# Patient Record
Sex: Female | Born: 1935 | Race: White | Hispanic: No | Smoking: Former smoker
Health system: Southern US, Community
[De-identification: ages and names within clinical notes are randomized; demographics above are authoritative.]

## PROBLEM LIST (undated history)

## (undated) DIAGNOSIS — I10 Essential (primary) hypertension: Secondary | ICD-10-CM

## (undated) DIAGNOSIS — G629 Polyneuropathy, unspecified: Secondary | ICD-10-CM

## (undated) DIAGNOSIS — F419 Anxiety disorder, unspecified: Secondary | ICD-10-CM

## (undated) DIAGNOSIS — N289 Disorder of kidney and ureter, unspecified: Secondary | ICD-10-CM

## (undated) DIAGNOSIS — L409 Psoriasis, unspecified: Secondary | ICD-10-CM

## (undated) DIAGNOSIS — E781 Pure hyperglyceridemia: Secondary | ICD-10-CM

## (undated) HISTORY — PX: JOINT REPLACEMENT: SHX530

## (undated) HISTORY — PX: OTHER SURGICAL HISTORY: SHX169

---

## 2012-08-09 ENCOUNTER — Ambulatory Visit: Payer: Self-pay | Admitting: Nurse Practitioner

## 2013-08-06 ENCOUNTER — Ambulatory Visit: Payer: Self-pay | Admitting: Ophthalmology

## 2015-02-19 ENCOUNTER — Encounter: Payer: Self-pay | Admitting: Emergency Medicine

## 2015-02-19 ENCOUNTER — Emergency Department: Payer: Medicare Other

## 2015-02-19 ENCOUNTER — Inpatient Hospital Stay
Admission: EM | Admit: 2015-02-19 | Discharge: 2015-02-24 | DRG: 563 | Disposition: A | Discharge status: Skilled Nursing Facility | Payer: Medicare Other | Attending: Internal Medicine | Admitting: Internal Medicine

## 2015-02-19 DIAGNOSIS — Z66 Do not resuscitate: Secondary | ICD-10-CM | POA: Diagnosis present

## 2015-02-19 DIAGNOSIS — D72829 Elevated white blood cell count, unspecified: Secondary | ICD-10-CM | POA: Diagnosis present

## 2015-02-19 DIAGNOSIS — S92351A Displaced fracture of fifth metatarsal bone, right foot, initial encounter for closed fracture: Secondary | ICD-10-CM | POA: Diagnosis present

## 2015-02-19 DIAGNOSIS — N184 Chronic kidney disease, stage 4 (severe): Secondary | ICD-10-CM | POA: Diagnosis present

## 2015-02-19 DIAGNOSIS — Z87891 Personal history of nicotine dependence: Secondary | ICD-10-CM

## 2015-02-19 DIAGNOSIS — I129 Hypertensive chronic kidney disease with stage 1 through stage 4 chronic kidney disease, or unspecified chronic kidney disease: Secondary | ICD-10-CM | POA: Diagnosis present

## 2015-02-19 DIAGNOSIS — D649 Anemia, unspecified: Secondary | ICD-10-CM | POA: Diagnosis present

## 2015-02-19 DIAGNOSIS — S82101A Unspecified fracture of upper end of right tibia, initial encounter for closed fracture: Principal | ICD-10-CM | POA: Diagnosis present

## 2015-02-19 DIAGNOSIS — I2510 Atherosclerotic heart disease of native coronary artery without angina pectoris: Secondary | ICD-10-CM | POA: Diagnosis present

## 2015-02-19 DIAGNOSIS — F419 Anxiety disorder, unspecified: Secondary | ICD-10-CM | POA: Diagnosis present

## 2015-02-19 DIAGNOSIS — Z882 Allergy status to sulfonamides status: Secondary | ICD-10-CM

## 2015-02-19 DIAGNOSIS — E781 Pure hyperglyceridemia: Secondary | ICD-10-CM | POA: Diagnosis present

## 2015-02-19 DIAGNOSIS — Z9660 Presence of unspecified orthopedic joint implant: Secondary | ICD-10-CM | POA: Diagnosis present

## 2015-02-19 DIAGNOSIS — S92301A Fracture of unspecified metatarsal bone(s), right foot, initial encounter for closed fracture: Secondary | ICD-10-CM | POA: Diagnosis not present

## 2015-02-19 DIAGNOSIS — W109XXA Fall (on) (from) unspecified stairs and steps, initial encounter: Secondary | ICD-10-CM | POA: Diagnosis present

## 2015-02-19 DIAGNOSIS — S82209A Unspecified fracture of shaft of unspecified tibia, initial encounter for closed fracture: Secondary | ICD-10-CM | POA: Diagnosis present

## 2015-02-19 DIAGNOSIS — Z8249 Family history of ischemic heart disease and other diseases of the circulatory system: Secondary | ICD-10-CM

## 2015-02-19 HISTORY — DX: Anxiety disorder, unspecified: F41.9

## 2015-02-19 HISTORY — DX: Disorder of kidney and ureter, unspecified: N28.9

## 2015-02-19 HISTORY — DX: Polyneuropathy, unspecified: G62.9

## 2015-02-19 HISTORY — DX: Pure hyperglyceridemia: E78.1

## 2015-02-19 HISTORY — DX: Essential (primary) hypertension: I10

## 2015-02-19 HISTORY — DX: Psoriasis, unspecified: L40.9

## 2015-02-19 LAB — CBC WITH DIFFERENTIAL/PLATELET
BASOS ABS: 0.1 10*3/uL (ref 0–0.1)
EOS ABS: 0.2 10*3/uL (ref 0–0.7)
HCT: 30.2 % — ABNORMAL LOW (ref 35.0–47.0)
Hemoglobin: 10 g/dL — ABNORMAL LOW (ref 12.0–16.0)
Lymphocytes Relative: 13 %
Lymphs Abs: 1.5 10*3/uL (ref 1.0–3.6)
MCH: 30.3 pg (ref 26.0–34.0)
MCHC: 33.1 g/dL (ref 32.0–36.0)
MCV: 91.6 fL (ref 80.0–100.0)
MONO ABS: 0.9 10*3/uL (ref 0.2–0.9)
Monocytes Relative: 8 %
Neutro Abs: 8.8 10*3/uL — ABNORMAL HIGH (ref 1.4–6.5)
Neutrophils Relative %: 77 %
PLATELETS: 311 10*3/uL (ref 150–440)
RBC: 3.3 MIL/uL — ABNORMAL LOW (ref 3.80–5.20)
RDW: 15.7 % — AB (ref 11.5–14.5)
WBC: 11.5 10*3/uL — ABNORMAL HIGH (ref 3.6–11.0)

## 2015-02-19 LAB — BASIC METABOLIC PANEL
ANION GAP: 10 (ref 5–15)
BUN: 47 mg/dL — ABNORMAL HIGH (ref 6–20)
CALCIUM: 9.8 mg/dL (ref 8.9–10.3)
CO2: 23 mmol/L (ref 22–32)
Chloride: 107 mmol/L (ref 101–111)
Creatinine, Ser: 2.38 mg/dL — ABNORMAL HIGH (ref 0.44–1.00)
GFR, EST AFRICAN AMERICAN: 21 mL/min — AB (ref >60)
GFR, EST NON AFRICAN AMERICAN: 18 mL/min — AB (ref >60)
GLUCOSE: 111 mg/dL — AB (ref 65–99)
Potassium: 5 mmol/L (ref 3.5–5.1)
SODIUM: 140 mmol/L (ref 135–145)

## 2015-02-19 MED ORDER — FENOFIBRATE 160 MG PO TABS
160.0000 mg | ORAL_TABLET | Freq: Every day | ORAL | Status: DC
  Administered 2015-02-20 – 2015-02-24 (×5): 160 mg via ORAL
  Filled 2015-02-19 (×5): qty 1

## 2015-02-19 MED ORDER — OXYCODONE HCL 5 MG PO TABS
5.0000 mg | ORAL_TABLET | ORAL | Status: DC | PRN
  Administered 2015-02-19 – 2015-02-20 (×2): 5 mg via ORAL
  Filled 2015-02-19 (×2): qty 1

## 2015-02-19 MED ORDER — ALPRAZOLAM 0.25 MG PO TABS
0.2500 mg | ORAL_TABLET | Freq: Three times a day (TID) | ORAL | Status: DC | PRN
  Administered 2015-02-22: 0.25 mg via ORAL
  Filled 2015-02-19: qty 1

## 2015-02-19 MED ORDER — ACETAMINOPHEN 650 MG RE SUPP: 650.0000 mg | Freq: Four times a day (QID) | RECTAL | Status: DC | PRN

## 2015-02-19 MED ORDER — ENOXAPARIN SODIUM 30 MG/0.3ML ~~LOC~~ SOLN: 30.0000 mg | SUBCUTANEOUS | Status: DC

## 2015-02-19 MED ORDER — METOPROLOL SUCCINATE ER 100 MG PO TB24
100.0000 mg | ORAL_TABLET | Freq: Every day | ORAL | Status: DC
  Administered 2015-02-20 – 2015-02-24 (×5): 100 mg via ORAL
  Filled 2015-02-19 (×5): qty 1

## 2015-02-19 MED ORDER — AMLODIPINE BESYLATE 5 MG PO TABS
5.0000 mg | ORAL_TABLET | Freq: Every day | ORAL | Status: DC
  Administered 2015-02-20 – 2015-02-23 (×4): 5 mg via ORAL
  Filled 2015-02-19 (×4): qty 1

## 2015-02-19 MED ORDER — OXYCODONE-ACETAMINOPHEN 5-325 MG PO TABS
1.0000 | ORAL_TABLET | Freq: Once | ORAL | Status: AC
  Administered 2015-02-19: 1 via ORAL
  Filled 2015-02-19: qty 1

## 2015-02-19 MED ORDER — LORATADINE 10 MG PO TABS
10.0000 mg | ORAL_TABLET | Freq: Every day | ORAL | Status: DC
  Administered 2015-02-19 – 2015-02-24 (×5): 10 mg via ORAL
  Filled 2015-02-19 (×5): qty 1

## 2015-02-19 MED ORDER — LISINOPRIL 20 MG PO TABS
40.0000 mg | ORAL_TABLET | Freq: Every day | ORAL | Status: DC
  Administered 2015-02-20 – 2015-02-22 (×3): 40 mg via ORAL
  Administered 2015-02-23 (×2): 20 mg via ORAL
  Administered 2015-02-24: 40 mg via ORAL
  Filled 2015-02-19: qty 2
  Filled 2015-02-19: qty 1
  Filled 2015-02-19 (×4): qty 2

## 2015-02-19 MED ORDER — ASPIRIN EC 81 MG PO TBEC
81.0000 mg | DELAYED_RELEASE_TABLET | Freq: Every day | ORAL | Status: DC
  Administered 2015-02-20 – 2015-02-24 (×5): 81 mg via ORAL
  Filled 2015-02-19 (×5): qty 1

## 2015-02-19 MED ORDER — ACETAMINOPHEN 325 MG PO TABS: 650.0000 mg | ORAL_TABLET | Freq: Four times a day (QID) | ORAL | Status: DC | PRN

## 2015-02-19 NOTE — H&P (Signed)
[facility] [place] [facility]   PATIENT NAME: Lacey Smith    MR#:  [id number]  DATE OF BIRTH:  1928-12-10  DATE OF ADMISSION:  02/19/2015  PRIMARY CARE PHYSICIAN: Imelda Pillow, NP   REQUESTING/REFERRING PHYSICIAN: Dr. Derrill Kay  CHIEF COMPLAINT:   Chief Complaint  Patient presents with  . Fall    HISTORY OF PRESENT ILLNESS:  Cara Thaxton  is a 81 y.o. female with a known history of hypertension, stage III chronic kidney disease, hypertriglyceridemia. Patient states that she had a fall out stairs on the stone steps down 3 steps. She states the steps were wet. She had her cell phone on her and called family. She was on the ground in the range of 30 minutes. She does have some pain in the right lower extremity and foot. In the ER she was found to have a nondisplaced oblique fracture of the proximal tibia and possibly fibula and also fracture of the fifth metatarsal. Orthopedics was called from the ER and advised leg immobilizer and follow-up in the office within a week. The patient was unable to put any weight down on the foot and leg and hospitalist services were contacted for further evaluation.  PAST MEDICAL HISTORY:   Past Medical History  Diagnosis Date  . Renal disorder   . Hypertension   . Hypertriglyceridemia   . Anxiety   . Neuropathy   . Psoriasis     PAST SURGICAL HISTORY:   Past Surgical History  Procedure Laterality Date  . Joint replacement    . Cataracts      SOCIAL HISTORY:   Social History  Substance Use Topics  . Smoking status: Former Smoker -- 55 years    Quit date: 02/18/2005  . Smokeless tobacco: Not on file  . Alcohol Use: No    FAMILY HISTORY:   Family History  Problem Relation Age of Onset  . Heart failure Mother   . Heart attack Father   . Rheumatic fever Father     DRUG ALLERGIES:   Allergies  Allergen Reactions  . Sulfa Antibiotics     REVIEW OF SYSTEMS:  CONSTITUTIONAL: No fever,  cold feeling after getting in from the rain. Shaking episode earlier. Some weight loss. EYES: No blurred or double vision. Wears glasses EARS, NOSE, AND THROAT: No tinnitus or ear pain. No sore throat. Some decreased hearing. Positive for runny nose RESPIRATORY: No cough, occasional shortness of breath, no wheezing or hemoptysis.  CARDIOVASCULAR: No chest pain, orthopnea, edema.  GASTROINTESTINAL: No nausea, vomiting, diarrhea or abdominal pain. No blood in bowel movements GENITOURINARY: No dysuria, hematuria.  ENDOCRINE: No polyuria, nocturia,  HEMATOLOGY: No anemia, easy bruising or bleeding SKIN: History of psoriasis MUSCULOSKELETAL: Positive for arthritis pain NEUROLOGIC: No tingling, numbness, weakness.  PSYCHIATRY: Occasional anxiety  MEDICATIONS AT HOME:   Prior to Admission medications   Not on File    Patient takes Norvasc 5 mg by mouth daily, metoprolol 100 mg by mouth daily, lisinopril 40 mg by mouth daily, fenofibrate 145 mg daily, Xanax 0.25 mg when necessary, Zyrtec 10 mg daily, Centrum Silver 1 tablet daily, aspirin 81 mg daily, calcium and vitamin D and Fish oil.  VITAL SIGNS:  Blood pressure 170/76, pulse 72, temperature 98.1 F (36.7 C), temperature source Oral, resp. rate 16, height  (1.651 m), weight 65.318 kg (144 lb), SpO2 100 %.  PHYSICAL EXAMINATION:  GENERAL:  81 y.o.-year-old patient lying in the bed with no acute distress.  EYES: Pupils  equal, round, reactive to light and accommodation. No scleral icterus. Extraocular muscles intact.  HEENT: Head atraumatic, normocephalic. Oropharynx and nasopharynx clear.  NECK:  Supple, no jugular venous distention. No thyroid enlargement, no tenderness.  LUNGS: Normal breath sounds bilaterally, no wheezing, rales,rhonchi or crepitation. No use of accessory muscles of respiration.  CARDIOVASCULAR: S1, S2 normal. No murmurs, rubs, or gallops.  ABDOMEN: Soft, nontender, nondistended. Bowel sounds present. No  organomegaly or mass.  EXTREMITIES: No pedal edema, cyanosis, or clubbing.  NEUROLOGIC: Cranial nerves II through XII are intact. Muscle strength 5/5 in upper extremities. Sensation intact to light touch bilateral feet. Patient able to push down with her toes but very painful with the right foot PSYCHIATRIC: The patient is alert and oriented x 3.  SKIN: Bruising right forehead. Bruising right lateral foot with some scrapes. Also scraped left shin. Right leg in an immobilizer  LABORATORY PANEL:  Sent  RADIOLOGY:  Dg Tibia/fibula Right  02/19/2015   CLINICAL DATA:  Acute right lower leg pain following fall today. Initial encounter.  EXAM: RIGHT TIBIA AND FIBULA - 2 VIEW  COMPARISON:  None.  FINDINGS: A nondisplaced oblique fracture of the proximal tibia is seen only on the lateral view is noted.  A possible nondisplaced fracture of the fibular head is present.  There is no evidence of subluxation or dislocation.  IMPRESSION: Nondisplaced oblique fracture of the proximal tibia and possible nondisplaced fracture of the fibular head.   Electronically Signed   By: Harmon Pier M.D.   On: 02/19/2015 16:01   Ct Head Wo Contrast  02/19/2015   CLINICAL DATA:  The patient was walking down stone steps and tripped and fell around 0820 this AM. Pt has swelling and bruising to right knee and foot. Pt has abrasion noted to left knee and shin. Pt also has hematoma noted to right side of forehead.  EXAM: CT HEAD WITHOUT CONTRAST  TECHNIQUE: Contiguous axial images were obtained from the base of the skull through the vertex without intravenous contrast.  COMPARISON:  None.  FINDINGS: There is central and cortical atrophy. Periventricular white matter changes are consistent with small vessel disease. There is no intra or extra-axial fluid collection or mass lesion. The basilar cisterns and ventricles have a normal appearance. There is no CT evidence for acute infarction or hemorrhage. Bone windows show right frontal scalp  edema not associated with underlying calvarial fracture. Images are degraded by patient motion artifact.  IMPRESSION: 1.  No evidence for acute intracranial abnormality. 2. Atrophy and small vessel disease. 3. Right frontal scalp edema.   Electronically Signed   By: Norva Pavlov M.D.   On: 02/19/2015 17:56   Dg Foot Complete Right  02/19/2015   CLINICAL DATA:  Lateral foot pain after falling today. Initial encounter.  EXAM: RIGHT FOOT COMPLETE - 3+ VIEW  COMPARISON:  None.  FINDINGS: The bones are demineralized. There is a nondisplaced fracture of the fifth metatarsal base which probably demonstrates intra-articular extension. The Lisfranc alignment is normal. There are moderate degenerative changes at the first metatarsal phalangeal joint with a hallux valgus formation.  IMPRESSION: Nondisplaced intra-articular fracture of the fifth metatarsal base.   Electronically Signed   By: Carey Bullocks M.D.   On: 02/19/2015 15:58    EKG:  Not done  IMPRESSION AND PLAN:   1. Tibia fracture and possible fibular fracture. Fifth metatarsal fracture. Will admit as an observation and orthopedic consultation. Get physical therapy consultation. Patient interested in rehabilitation. Pain control with oral  medications. 2. Essential hypertension- continue usual medications. 3. Hyperlipidemia unspecified continue fenofibrate 4. Anxiety- when necessary Xanax. 5. Stage III chronic kidney disease- labs pending  All the records are reviewed and case discussed with ED provider. Management plans discussed with the patient, family and they are in agreement.  CODE STATUS: DO NOT RESUSCITATE  TOTAL TIME TAKING CARE OF THIS PATIENT: 50 minutes minutes.    Alford Highland M.D on 02/19/2015 at 7:11 PM  Between 7am to 6pm - Pager - [phone]  After 6pm call admission pager [phone]  [facility]  Office  [phone]  CC: Primary care physician; Imelda Pillow, NP

## 2015-02-19 NOTE — ED Notes (Signed)
Pt was walking down stone steps and tripped and fell around 0820 this AM. Pt has swelling and bruising to right knee and foot. Pt has abrasion noted to left knee and shin. Pt also has hematoma noted to right side of forehead. Pt denies LOC and is alert and oriented upon arrival to ER. Pt states that her son found her outside around 9 AM and was able to help her inside at that time. Pt states that the pain has gotten worse and she is unable to stand or put pressure on right foot at this time.

## 2015-02-19 NOTE — Progress Notes (Signed)
ANTICOAGULATION CONSULT NOTE - Initial Consult  Pharmacy Consult for Lovenox Indication: VTE prophylaxis  Allergies  Allergen Reactions  . Sulfa Antibiotics     Patient Measurements: Height:  (165.1 cm) Weight: 144 lb (65.318 kg) IBW/kg (Calculated) : 57 Heparin Dosing Weight:   Vital Signs: Temp: 98.6 F (37 C) (09/22 1927) Temp Source: Oral (09/22 1927) BP: 183/73 mmHg (09/22 1927) Pulse Rate: 76 (09/22 1927)  Labs:  Recent Labs  02/19/15 1825  HGB 10.0*  HCT 30.2*  PLT PENDING  CREATININE 2.38*    Estimated Creatinine Clearance: 16.1 mL/min (by C-G formula based on Cr of 2.38).   Medical History: Past Medical History  Diagnosis Date  . Renal disorder   . Hypertension   . Hypertriglyceridemia   . Anxiety   . Neuropathy   . Psoriasis     Medications:  No prescriptions prior to admission    Assessment: CrCl = 16.1 ml/min  Goal of Therapy:  DVT prophylaxis    Plan:  Lovenox 40 mg SQ Q24H originally ordered.  Will adjust ordered lovenox 30 mg SQ Q24H based on CrCl < 30 ml/min.   Robbins,Jason D 02/19/2015,7:31 PM

## 2015-02-19 NOTE — Progress Notes (Signed)
Dr Martha Clan was notified of consult. He will see pt in am 9/23.

## 2015-02-19 NOTE — ED Provider Notes (Signed)
[facility] Emergency Department Provider Note   ____________________________________________  Time seen: 1510  I have reviewed the triage vital signs and the nursing notes.   HISTORY  Chief Complaint Fall   History limited by: Not Limited   HPI Lacey Smith is a 81 y.o. female who presented to the emergency department today after a mechanical fall. The patient was walking outside when she slipped on wet stones. She fell down a step. She didn't not lose consciousness. She complains primarily of right foot and right lower leg pain. She also suffered a bruise to her right forehead although denies any headaches. She denies any neck or spine pain. She denies any chest pain or shortness breath.     Past Medical History  Diagnosis Date  . Renal disorder   . Hypertension     There are no active problems to display for this patient.   Past Surgical History  Procedure Laterality Date  . Joint replacement      No current outpatient prescriptions on file.  Allergies Sulfa antibiotics  Family History  Problem Relation Age of Onset  . Heart failure Mother   . Heart attack Father     Social History Social History  Substance Use Topics  . Smoking status: Former Smoker -- 55 years    Quit date: 02/18/2005  . Smokeless tobacco: None  . Alcohol Use: No    Review of Systems  Constitutional: Negative for fever. Cardiovascular: Negative for chest pain. Respiratory: Negative for shortness of breath. Gastrointestinal: Negative for abdominal pain, vomiting and diarrhea. Genitourinary: Negative for dysuria. Musculoskeletal: Negative for back pain. Positive for right lower leg and foot pain Skin: Negative for rash. Neurological: Negative for headaches, focal weakness or numbness.   10-point ROS otherwise negative.  ____________________________________________   PHYSICAL EXAM:  VITAL SIGNS: ED Triage Vitals  Enc Vitals Group     BP  02/19/15 1453 158/73 mmHg     Pulse Rate 02/19/15 1453 70     Resp 02/19/15 1453 18     Temp 02/19/15 1453 98.1 F (36.7 C)     Temp Source 02/19/15 1453 Oral     SpO2 02/19/15 1453 100 %     Weight 02/19/15 1453 144 lb (65.318 kg)     Height 02/19/15 1453  (1.651 m)     Head Cir --      Peak Flow --      Pain Score 02/19/15 1450 0   Constitutional: Alert and oriented. Well appearing and in no distress. Eyes: Conjunctivae are normal. PERRL. Normal extraocular movements. ENT   Head: Normocephalic. Hematoma over right forehead. No hemotympanum.    Nose: No congestion/rhinnorhea.   Mouth/Throat: Mucous membranes are moist.   Neck: No stridor. No midline tenderness. Hematological/Lymphatic/Immunilogical: No cervical lymphadenopathy. Cardiovascular: Normal rate, regular rhythm.  No murmurs, rubs, or gallops. Respiratory: Normal respiratory effort without tachypnea nor retractions. Breath sounds are clear and equal bilaterally. No wheezes/rales/rhonchi. Gastrointestinal: Soft and nontender. No distention.  Genitourinary: Deferred Musculoskeletal: Pain to palpation of the right dorsal lateral mid foot. Pain to palpation of the right proximal fibula. No obvious deformity of either lower extremity. Abrasions to the dorsum of the right foot, and left shin. NV intact distally Neurologic:  Normal speech and language. No gross focal neurologic deficits are appreciated. Speech is normal.  Skin:  Skin is warm, dry. Abrasions to right foot, left shin. Psychiatric: Mood and affect are normal. Speech and behavior are normal. Patient exhibits appropriate insight  and judgment.  ____________________________________________    LABS (pertinent positives/negatives)  Pending  ____________________________________________   EKG  None  ____________________________________________    RADIOLOGY  CT head  IMPRESSION: 1. No evidence for acute intracranial abnormality. 2.  Atrophy and small vessel disease. 3. Right frontal scalp edema.  ____________________________________________   PROCEDURES  Procedure(s) performed: None  Critical Care performed: No  ____________________________________________   INITIAL IMPRESSION / ASSESSMENT AND PLAN / ED COURSE  Pertinent labs & imaging results that were available during my care of the patient were reviewed by me and considered in my medical decision making (see chart for details).  Patient presents to the emergency department today after a mechanical fall. X-rays do show fractures of her proximal tibia and right fifth metatarsal. Also potential fracture of the fibula. Did discuss with Dr. Martha Clan of orthopedics who recommended knee immobilizer and hard soled shoe. We did attempt to get the patient up and walking with walker however patient was not able to do this. Will admit for further management.  ____________________________________________   FINAL CLINICAL IMPRESSION(S) / ED DIAGNOSES  Final diagnoses:  Metatarsal fracture, right, closed, initial encounter     Phineas Semen, MD 02/19/15 1859

## 2015-02-20 LAB — BASIC METABOLIC PANEL
ANION GAP: 8 (ref 5–15)
BUN: 45 mg/dL — ABNORMAL HIGH (ref 6–20)
CHLORIDE: 109 mmol/L (ref 101–111)
CO2: 25 mmol/L (ref 22–32)
Calcium: 9.5 mg/dL (ref 8.9–10.3)
Creatinine, Ser: 2.34 mg/dL — ABNORMAL HIGH (ref 0.44–1.00)
GFR calc non Af Amer: 18 mL/min — ABNORMAL LOW (ref >60)
GFR, EST AFRICAN AMERICAN: 21 mL/min — AB (ref >60)
Glucose, Bld: 99 mg/dL (ref 65–99)
POTASSIUM: 4 mmol/L (ref 3.5–5.1)
SODIUM: 142 mmol/L (ref 135–145)

## 2015-02-20 LAB — CBC
HEMATOCRIT: 29.9 % — AB (ref 35.0–47.0)
Hemoglobin: 9.9 g/dL — ABNORMAL LOW (ref 12.0–16.0)
MCH: 30.3 pg (ref 26.0–34.0)
MCHC: 33 g/dL (ref 32.0–36.0)
MCV: 92 fL (ref 80.0–100.0)
PLATELETS: 250 10*3/uL (ref 150–440)
RBC: 3.26 MIL/uL — AB (ref 3.80–5.20)
RDW: 16.1 % — ABNORMAL HIGH (ref 11.5–14.5)
WBC: 8.5 10*3/uL (ref 3.6–11.0)

## 2015-02-20 MED ORDER — TRAMADOL HCL 50 MG PO TABS
50.0000 mg | ORAL_TABLET | Freq: Four times a day (QID) | ORAL | Status: DC | PRN
  Administered 2015-02-20 – 2015-02-23 (×8): 50 mg via ORAL
  Filled 2015-02-20 (×8): qty 1

## 2015-02-20 MED ORDER — CLONIDINE HCL 0.1 MG PO TABS
0.1000 mg | ORAL_TABLET | Freq: Two times a day (BID) | ORAL | Status: DC
  Administered 2015-02-20 – 2015-02-21 (×3): 0.1 mg via ORAL
  Filled 2015-02-20 (×3): qty 1

## 2015-02-20 MED ORDER — PROMETHAZINE HCL 25 MG/ML IJ SOLN: 12.5000 mg | Freq: Four times a day (QID) | INTRAMUSCULAR | Status: DC | PRN

## 2015-02-20 MED ORDER — HEPARIN SODIUM (PORCINE) 5000 UNIT/ML IJ SOLN
5000.0000 [IU] | Freq: Three times a day (TID) | INTRAMUSCULAR | Status: DC
  Administered 2015-02-20 – 2015-02-24 (×11): 5000 [IU] via SUBCUTANEOUS
  Filled 2015-02-20 (×10): qty 1

## 2015-02-20 MED ORDER — INFLUENZA VAC SPLIT QUAD 0.5 ML IM SUSY
0.5000 mL | PREFILLED_SYRINGE | INTRAMUSCULAR | Status: AC
  Administered 2015-02-21: 0.5 mL via INTRAMUSCULAR
  Filled 2015-02-20: qty 0.5

## 2015-02-20 MED ORDER — OXYCODONE HCL 5 MG PO TABS
5.0000 mg | ORAL_TABLET | ORAL | Status: DC | PRN
  Administered 2015-02-21 – 2015-02-24 (×7): 5 mg via ORAL
  Filled 2015-02-20 (×7): qty 1

## 2015-02-20 MED ORDER — PNEUMOCOCCAL VAC POLYVALENT 25 MCG/0.5ML IJ INJ
0.5000 mL | INJECTION | INTRAMUSCULAR | Status: AC
  Administered 2015-02-21: 0.5 mL via INTRAMUSCULAR
  Filled 2015-02-20: qty 0.5

## 2015-02-20 MED ORDER — ONDANSETRON HCL 4 MG/2ML IJ SOLN
4.0000 mg | Freq: Four times a day (QID) | INTRAMUSCULAR | Status: DC | PRN
Start: 2015-02-20 — End: 2015-02-24
  Administered 2015-02-20 – 2015-02-23 (×2): 4 mg via INTRAVENOUS
  Filled 2015-02-20 (×2): qty 2

## 2015-02-20 NOTE — Plan of Care (Signed)
Problem: Phase I Progression Outcomes Goal: Hemodynamically stable Outcome: Progressing Pt is alert and oriented x 4, c/o moderate pain in right leg controlled with tramadol x 1, fair appetite, family at bedside, blood pressure hypertensive but improving with medication management, zofran giving for nausea, soft cast at bedside to use with movement, physical therapy worked with patient, awaiting to hear from insurance company to determine discharge plan.

## 2015-02-20 NOTE — Progress Notes (Signed)
Patient converted from phenergan  prescription, to phenergan 12.5mg   prescription, per [employer] policy that patients age 79 years and older should receive max dose of 12.5mg  IV phenergan.

## 2015-02-20 NOTE — Clinical Social Work Note (Signed)
Clinical Social Work Assessment  Patient Details  Name: Lacey Smith MRN: [id number] Date of Birth: 27-May-1929  Date of referral:  02/20/15               Reason for consult:  Facility Placement                Permission sought to share information with:  Facility Medical sales representative, Family Supports Permission granted to share information::  Yes, Verbal Permission Granted  Name::      Lacey Smith [phone] and daughter  Lacey Smith)  Agency::   [facility] for bed search)  Relationship::     Contact Information:     Housing/Transportation Living arrangements for the past 2 months:  Single Family Home Source of Information:  Patient Patient Interpreter Needed:  None Criminal Activity/Legal Involvement Pertinent to Current Situation/Hospitalization:  No - Comment as needed Significant Relationships:  Adult Children Lives with:  Self Do you feel safe going back to the place where you live?  No (unable to care for self) Need for family participation in patient care:     Care giving concerns:  Patient unable to discharge home alone.   Social Worker assessment / plan:  Patient is a 81 year old female, reported to the hospital after a fall.  Patient is alert, oriented and engaged in conversation with CSW.  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  She is a widow with one son in [place] and one daughter in [place]. Per patient both are her HPOA. Patient is very angry that she does not meet inpatient requirements so that her insurance will cover her rehab. Patient was evaluated by PT with recommendation for SNF so that patient is able to return to previous level of  Functioning.  CSW explained options to patient and patient is willing to private pay in order to go to SNF as she is unable to discharge home alone.    CSW will completed FL2, place on chart and fax out as private pay in anticipation of patient discharging to SNF when medically ready.  Employment status:   Retired Health and safety inspector:  Medicare PT Recommendations:  Skilled Nursing Facility Information / Referral to community resources:  Skilled Nursing Facility  Patient/Family's Response to care:  Patient is in agreement that she needs to go to SNF.   Patient/Family's Understanding of and Emotional Response to Diagnosis, Current Treatment, and Prognosis:  Patient understands she will remain in the hospital until medically stable then discharge to SNF ( private pay).  Emotional Assessment Appearance:  Appears younger than stated age Attitude/Demeanor/Rapport:    Affect (typically observed):  Accepting, Frustrated, Pleasant, Appropriate Orientation:  Oriented to Self, Oriented to Place, Oriented to  Time, Oriented to Situation Alcohol / Substance use:  Never Used Psych involvement (Current and /or in the community):  No (Comment)  Discharge Needs  Concerns to be addressed:  Denies Needs/Concerns at this time Readmission within the last 30 days:  No Current discharge risk:  Lives alone, Dependent with Mobility Barriers to Discharge:  No Barriers Identified   Lacey Pilon, LCSW 02/20/2015, 4:41 PM Lacey Smith, MSW Clinical Social Work Department Emergency Room [phone] 4:46 PM

## 2015-02-20 NOTE — Consult Note (Signed)
ORTHOPAEDIC CONSULTATION  REQUESTING PHYSICIAN: Katharina Caper, MD  Chief Complaint: Fractures to right tibia and right foot status post fall  HPI: Lacey Smith is a 81 y.o. female who complains of  pain in the right lower extremity following a fall. She slipped going down wet stairs yesterday landing on her right side. She was unable to stand following this injury. She contacted family and was brought to the [facility]. ER she was diagnosed with a fracture of her right tibia and right fifth metatarsal. Patient does have pain in the right leg and foot. She states that she had significant difficulty getting out of bed with physical therapy today. I spoke with the physical therapist was recommended the patient go to a skilled nursing facility. Patient is seen today with her daughter and son at the bedside.  Past Medical History  Diagnosis Date  . Renal disorder   . Hypertension   . Hypertriglyceridemia   . Anxiety   . Neuropathy   . Psoriasis    Past Surgical History  Procedure Laterality Date  . Joint replacement    . Cataracts     Social History   Social History  . Marital Status: Widowed    Spouse Name: N/A  . Number of Children: N/A  . Years of Education: N/A   Social History Main Topics  . Smoking status: Former Smoker -- 55 years    Quit date: 02/18/2005  . Smokeless tobacco: None  . Alcohol Use: No  . Drug Use: No  . Sexual Activity: Not Asked   Other Topics Concern  . None   Social History Narrative  . None   Family History  Problem Relation Age of Onset  . Heart failure Mother   . Heart attack Father   . Rheumatic fever Father    Allergies  Allergen Reactions  . Sulfa Antibiotics    Prior to Admission medications   Medication Sig Start Date End Date Taking? Authorizing Provider  ALPRAZolam (XANAX) 0.25 MG tablet Take 0.25 mg by mouth 2 (two) times daily as needed for anxiety.   Yes Historical Provider, MD  amLODipine (NORVASC)  5 MG tablet Take 5 mg by mouth daily.   Yes Historical Provider, MD  aspirin EC 81 MG tablet Take 81 mg by mouth daily.   Yes Historical Provider, MD  cetirizine (ZYRTEC) 10 MG tablet Take 10 mg by mouth daily.   Yes Historical Provider, MD  cloNIDine (CATAPRES) 0.1 MG tablet Take 0.1 mg by mouth 2 (two) times daily.   Yes Historical Provider, MD  fenofibrate (TRICOR) 145 MG tablet Take 145 mg by mouth daily.   Yes Historical Provider, MD  lisinopril (PRINIVIL,ZESTRIL) 40 MG tablet Take 40 mg by mouth daily.   Yes Historical Provider, MD  metoprolol succinate (TOPROL-XL) 100 MG 24 hr tablet Take 100 mg by mouth daily. Take with or immediately following a meal.   Yes Historical Provider, MD   Dg Tibia/fibula Right  02/19/2015   CLINICAL DATA:  Acute right lower leg pain following fall today. Initial encounter.  EXAM: RIGHT TIBIA AND FIBULA - 2 VIEW  COMPARISON:  None.  FINDINGS: A nondisplaced oblique fracture of the proximal tibia is seen only on the lateral view is noted.  A possible nondisplaced fracture of the fibular head is present.  There is no evidence of subluxation or dislocation.  IMPRESSION: Nondisplaced oblique fracture of the proximal tibia and possible nondisplaced fracture of the fibular head.   Electronically Signed  By: Harmon Pier M.D.   On: 02/19/2015 16:01   Ct Head Wo Contrast  02/19/2015   CLINICAL DATA:  The patient was walking down stone steps and tripped and fell around 0820 this AM. Pt has swelling and bruising to right knee and foot. Pt has abrasion noted to left knee and shin. Pt also has hematoma noted to right side of forehead.  EXAM: CT HEAD WITHOUT CONTRAST  TECHNIQUE: Contiguous axial images were obtained from the base of the skull through the vertex without intravenous contrast.  COMPARISON:  None.  FINDINGS: There is central and cortical atrophy. Periventricular white matter changes are consistent with small vessel disease. There is no intra or extra-axial fluid  collection or mass lesion. The basilar cisterns and ventricles have a normal appearance. There is no CT evidence for acute infarction or hemorrhage. Bone windows show right frontal scalp edema not associated with underlying calvarial fracture. Images are degraded by patient motion artifact.  IMPRESSION: 1.  No evidence for acute intracranial abnormality. 2. Atrophy and small vessel disease. 3. Right frontal scalp edema.   Electronically Signed   By: Norva Pavlov M.D.   On: 02/19/2015 17:56   Dg Foot Complete Right  02/19/2015   CLINICAL DATA:  Lateral foot pain after falling today. Initial encounter.  EXAM: RIGHT FOOT COMPLETE - 3+ VIEW  COMPARISON:  None.  FINDINGS: The bones are demineralized. There is a nondisplaced fracture of the fifth metatarsal base which probably demonstrates intra-articular extension. The Lisfranc alignment is normal. There are moderate degenerative changes at the first metatarsal phalangeal joint with a hallux valgus formation.  IMPRESSION: Nondisplaced intra-articular fracture of the fifth metatarsal base.   Electronically Signed   By: Carey Bullocks M.D.   On: 02/19/2015 15:58    Positive ROS: All other systems have been reviewed and were otherwise negative with the exception of those mentioned in the HPI and as above.  Physical Exam: General: Alert, no acute distress Psychiatric: Patient has normal mood and affect  MUSCULOSKELETAL: Right lower extremity: Patient's leg and foot compartments are soft and compressible. She has ecchymosis overlying the anterior proximal tibia and knee. She also has significant ecchymosis over the dorsum of the right foot. There is swelling in both the right knee and proximal leg as well as the right foot. She has palpable pedal pulses. She has intact sensation to light touch but states that her foot "feels slightly asleep". She can flex and extend her toes and dorsiflex and plantarflex her ankle.  Assessment: Nondisplaced fractures of  the right proximal tibia and right fifth metatarsal  Plan: I reviewed the patient's x-rays. Her fractures will be managed nonoperatively. She was given a knee immobilizer for treatment of her proximal tibia fracture. I have recommended a hard soled shoe for her right foot. Patient must remain nonweightbearing on the right lower extremity for a minimum of 6 weeks for these injuries. She should continue to elevate her right lower extremity and apply ice to the affected areas as needed. She should continue physical and occupational therapy. I recommend she go to a skilled nursing facility upon discharge as she lives alone and has limited upper body strength to walk any significant distance on a walker. The patient would likely benefit from a wheelchair with a leg support as well. She'll follow with me in approximately 10 days for reevaluation and x-ray in my office.     Juanell Fairly, MD    02/20/2015 1:37 PM

## 2015-02-20 NOTE — Evaluation (Signed)
Physical Therapy Evaluation Patient Details Name: Lacey Smith MRN: [id number] DOB: 1929-03-17 Today's Date: 02/20/2015   History of Present Illness  Pt fell down steps, suffered non-displaced L proximal tib fx and 5th metatarsal fx  Clinical Impression  Pt is a normally very independent with all ADLs, community ambulation, errands, etc.  She suffered a non-surgical L tib and 5th metatarsal fx and is very, very limited with mobility.  She shows great effort "walking" 3 ft with the walker but fatigues very quickly and with NWBing is obviously very reliant on UEs/walker to do even the smallest of hops.  She is eager to get back to her normal, independent self but realizes it will be a while before she could go home and bed safe/independent.     Follow Up Recommendations SNF (pt is in no way safe/appropriate to go home at this time)    Equipment Recommendations  Rolling walker with 5" wheels    Recommendations for Other Services       Precautions / Restrictions Precautions Precautions: Fall Required Braces or Orthoses: Knee Immobilizer - Left Restrictions Weight Bearing Restrictions: Yes LLE Weight Bearing: Non weight bearing      Mobility  Bed Mobility Overal bed mobility: Needs Assistance Bed Mobility: Supine to Sit;Sit to Supine     Supine to sit: Min assist Sit to supine: Min guard   General bed mobility comments: Pt is slow and labored with bed mobility, but shows good effort and with arm rails does not need heavy assist  Transfers Overall transfer level: Needs assistance Equipment used: Standard walker Transfers: Sit to/from Stand Sit to Stand: Min assist;Mod assist (much cuing for set up and encouragement)         General transfer comment: Pt reports increased pain with gravity dependent position but shows very good effort and motivation trying to stand despite lack of confidence.  Ambulation/Gait Ambulation/Gait assistance: Mod assist Ambulation Distance  (Feet): 3 Feet Assistive device: Standard walker       General Gait Details: Pt able to maintain NWBing on L well, but fatigues very quickly in her UEs and though she is able to do some very small hops, mostly just does heel-toe shuffling near EOB.    Stairs            Wheelchair Mobility    Modified Rankin (Stroke Patients Only)       Balance                                             Pertinent Vitals/Pain Pain Assessment: 0-10 Pain Score: 6     Home Living Family/patient expects to be discharged to:: Skilled nursing facility Living Arrangements: Alone                    Prior Function Level of Independence: Independent         Comments: Pt is able to do all that she needs w/o issue driving, running errands, etc     Hand Dominance        Extremity/Trunk Assessment   Upper Extremity Assessment: Overall WFL for tasks assessed           Lower Extremity Assessment: LLE deficits/detail (NT any WBing acts, L limited secondary to pain, R WFL)         Communication   Communication: No difficulties  Cognition Arousal/Alertness: Awake/alert Behavior  During Therapy: WFL for tasks assessed/performed Overall Cognitive Status: Within Functional Limits for tasks assessed                      General Comments      Exercises        Assessment/Plan    PT Assessment Patient needs continued PT services  PT Diagnosis Difficulty walking;Generalized weakness;Acute pain   PT Problem List Decreased strength;Decreased activity tolerance;Decreased balance;Decreased mobility;Decreased knowledge of use of DME;Decreased safety awareness  PT Treatment Interventions Gait training;DME instruction;Stair training;Functional mobility training;Therapeutic activities;Therapeutic exercise;Neuromuscular re-education;Balance training   PT Goals (Current goals can be found in the Care Plan section) Acute Rehab PT Goals Patient Stated Goal:  "I wish I could go home, but I know I wouldn't be able" PT Goal Formulation: With patient Time For Goal Achievement: 03/06/15 Potential to Achieve Goals: Good    Frequency Min 2X/week   Barriers to discharge        Co-evaluation               End of Session Equipment Utilized During Treatment: Gait belt;Left knee immobilizer Activity Tolerance: Patient limited by fatigue Patient left: with bed alarm set      Functional Assessment Tool Used: clinical judgement Functional Limitation: Mobility: Walking and moving around Mobility: Walking and Moving Around Current Status (O1308): At least 60 percent but less than 80 percent impaired, limited or restricted Mobility: Walking and Moving Around Goal Status [id number]): At least 40 percent but less than 60 percent impaired, limited or restricted    Time: 6962-9528 PT Time Calculation (min) (ACUTE ONLY): 29 min   Charges:   PT Evaluation $Initial PT Evaluation Tier I: 1 Procedure     PT G Codes:   PT G-Codes **NOT FOR INPATIENT CLASS** Functional Assessment Tool Used: clinical judgement Functional Limitation: Mobility: Walking and moving around Mobility: Walking and Moving Around Current Status (U1324): At least 60 percent but less than 80 percent impaired, limited or restricted Mobility: Walking and Moving Around Goal Status [phone]): At least 40 percent but less than 60 percent impaired, limited or restricted   Loran Senters, PT, DPT [id number]  Malachi Pro 02/20/2015, 1:48 PM

## 2015-02-20 NOTE — Progress Notes (Signed)
[facility] [place] [facility]   PATIENT NAME: Lacey Smith    MR#:  [id number]  DATE OF BIRTH:  08-10-1928  SUBJECTIVE:  CHIEF COMPLAINT:   Chief Complaint  Patient presents with  . Fall   patient is 81 year old Caucasian female with history of hypertension, CAD, hypertriglyceridemia who presented after fall and was noted to have nondisplaced oblique fracture of proximal tibia. Ortho surgery recommended to continue leg immobilizer and follow-up in the office within one week. However, patient was not able to walk and was admitted for treatment to the hospital. Today she complains of nausea and vomiting. She feels that this could hip been due to medications. She would like to stay over weekend in the hospital and then to be discharged to skilled nursing facility for rehabilitation next week   Review of Systems  Constitutional: Negative for fever, chills and weight loss.  HENT: Negative for congestion.   Eyes: Negative for blurred vision and double vision.  Respiratory: Negative for cough, sputum production, shortness of breath and wheezing.   Cardiovascular: Negative for chest pain, palpitations, orthopnea, leg swelling and PND.  Gastrointestinal: Positive for nausea and vomiting. Negative for abdominal pain, diarrhea, constipation and blood in stool.  Genitourinary: Negative for dysuria, urgency, frequency and hematuria.  Musculoskeletal: Negative for falls.  Neurological: Negative for dizziness, tremors, focal weakness and headaches.  Endo/Heme/Allergies: Does not bruise/bleed easily.  Psychiatric/Behavioral: Negative for depression. The patient does not have insomnia.     VITAL SIGNS: Blood pressure 184/82, pulse 77, temperature 98.7 F (37.1 C), temperature source Oral, resp. rate 18, height  (1.651 m), weight 71.305 kg (157 lb 3.2 oz), SpO2 100 %.  PHYSICAL EXAMINATION:   GENERAL:  81 y.o.-year-old patient lying in the bed with no acute  distress.  EYES: Pupils equal, round, reactive to light and accommodation. No scleral icterus. Extraocular muscles intact.  HEENT: Head atraumatic, normocephalic. Oropharynx and nasopharynx clear.  NECK:  Supple, no jugular venous distention. No thyroid enlargement, no tenderness.  LUNGS: Normal breath sounds bilaterally, no wheezing, rales,rhonchi or crepitation. No use of accessory muscles of respiration.  CARDIOVASCULAR: S1, S2 normal. No murmurs, rubs, or gallops.  ABDOMEN: Soft, nontender, nondistended. Bowel sounds present. No organomegaly or mass.  EXTREMITIES: No pedal edema, cyanosis, or clubbing.  right lower extremity tibial area is swollen, few scratches were noted of the skin. Left lower extremity has dressing on few skin scrapes NEUROLOGIC: Cranial nerves II through XII are intact. Muscle strength 5/5 in all extremities. Sensation intact. Gait not checked.  PSYCHIATRIC: The patient is alert and oriented x 3.  SKIN: No obvious rash, lesion, or ulcer.   ORDERS/RESULTS REVIEWED:   CBC  Recent Labs Lab 02/19/15 1825 02/20/15 0433  WBC 11.5* 8.5  HGB 10.0* 9.9*  HCT 30.2* 29.9*  PLT 311 250  MCV 91.6 92.0  MCH 30.3 30.3  MCHC 33.1 33.0  RDW 15.7* 16.1*  LYMPHSABS 1.5  --   MONOABS 0.9  --   EOSABS 0.2  --   BASOSABS 0.1  --    ------------------------------------------------------------------------------------------------------------------  Chemistries   Recent Labs Lab 02/19/15 1825 02/20/15 0433  NA 140 142  K 5.0 4.0  CL 107 109  CO2 23 25  GLUCOSE 111* 99  BUN 47* 45*  CREATININE 2.38* 2.34*  CALCIUM 9.8 9.5   ------------------------------------------------------------------------------------------------------------------ estimated creatinine clearance is 18 mL/min (by C-G formula based on Cr of 2.34). ------------------------------------------------------------------------------------------------------------------ No results for input(s): TSH,  T4TOTAL, T3FREE, THYROIDAB  in the last 72 hours.  Invalid input(s): FREET3  Cardiac Enzymes No results for input(s): CKMB, TROPONINI, MYOGLOBIN in the last 168 hours.  Invalid input(s): CK ------------------------------------------------------------------------------------------------------------------ Invalid input(s): POCBNP ---------------------------------------------------------------------------------------------------------------  RADIOLOGY: Dg Tibia/fibula Right  02/19/2015   CLINICAL DATA:  Acute right lower leg pain following fall today. Initial encounter.  EXAM: RIGHT TIBIA AND FIBULA - 2 VIEW  COMPARISON:  None.  FINDINGS: A nondisplaced oblique fracture of the proximal tibia is seen only on the lateral view is noted.  A possible nondisplaced fracture of the fibular head is present.  There is no evidence of subluxation or dislocation.  IMPRESSION: Nondisplaced oblique fracture of the proximal tibia and possible nondisplaced fracture of the fibular head.   Electronically Signed   By: Harmon Pier M.D.   On: 02/19/2015 16:01   Ct Head Wo Contrast  02/19/2015   CLINICAL DATA:  The patient was walking down stone steps and tripped and fell around 0820 this AM. Pt has swelling and bruising to right knee and foot. Pt has abrasion noted to left knee and shin. Pt also has hematoma noted to right side of forehead.  EXAM: CT HEAD WITHOUT CONTRAST  TECHNIQUE: Contiguous axial images were obtained from the base of the skull through the vertex without intravenous contrast.  COMPARISON:  None.  FINDINGS: There is central and cortical atrophy. Periventricular white matter changes are consistent with small vessel disease. There is no intra or extra-axial fluid collection or mass lesion. The basilar cisterns and ventricles have a normal appearance. There is no CT evidence for acute infarction or hemorrhage. Bone windows show right frontal scalp edema not associated with underlying calvarial fracture. Images  are degraded by patient motion artifact.  IMPRESSION: 1.  No evidence for acute intracranial abnormality. 2. Atrophy and small vessel disease. 3. Right frontal scalp edema.   Electronically Signed   By: Norva Pavlov M.D.   On: 02/19/2015 17:56   Dg Foot Complete Right  02/19/2015   CLINICAL DATA:  Lateral foot pain after falling today. Initial encounter.  EXAM: RIGHT FOOT COMPLETE - 3+ VIEW  COMPARISON:  None.  FINDINGS: The bones are demineralized. There is a nondisplaced fracture of the fifth metatarsal base which probably demonstrates intra-articular extension. The Lisfranc alignment is normal. There are moderate degenerative changes at the first metatarsal phalangeal joint with a hallux valgus formation.  IMPRESSION: Nondisplaced intra-articular fracture of the fifth metatarsal base.   Electronically Signed   By: Carey Bullocks M.D.   On: 02/19/2015 15:58    EKG: No orders found for this or any previous visit.  ASSESSMENT AND PLAN:  Active Problems:   Tibia fracture 1. Tibial fracture, continue immobilizer per orthopedic surgeon's recommendation as well as pain management with tramadol and oxycodone, getting physical therapy involved and possibly discharge patient to rehabilitation facility next week, consult social work 2. Nausea, vomiting. Initiate patient on Zofran as well as Phenergan as needed 3. Malignant essential hypertension. Resume clonidine 4. CK D. Stable following along 5. Leukocytosis, likely stress related, resolved 6. Anemia. Follow laboratory    Management plans discussed with the patient, family and they are in agreement.   DRUG ALLERGIES:  Allergies  Allergen Reactions  . Sulfa Antibiotics     CODE STATUS:     Code Status Orders        Start     Ordered   02/19/15 1906  Do not attempt resuscitation (DNR)   Continuous    Question Answer Comment  In the event of cardiac or respiratory ARREST Do not call a "code blue"   In the event of cardiac or  respiratory ARREST Do not perform Intubation, CPR, defibrillation or ACLS   In the event of cardiac or respiratory ARREST Use medication by any route, position, wound care, and other measures to relive pain and suffering. May use oxygen, suction and manual treatment of airway obstruction as needed for comfort.   Comments nurse may pronounce      02/19/15 1905    Advance Directive Documentation        Most Recent Value   Type of Advance Directive  Living will   Pre-existing out of facility DNR order (yellow form or pink MOST form)     "MOST" Form in Place?        TOTAL TIME TAKING CARE OF THe patient for 45 minutes.  patient's condition. Discharge planning. Treatment plan was discussed this patient as well as her daughter. All questions were answered, he voiced understanding and appreciation, time spent approximately 15 minutes for discussions  Samera Macy M.D on 02/20/2015 at 1:42 PM  Between 7am to 6pm - Pager - [phone]  After 6pm go to [email or web address] - password [email or web address] [facility]  [facility]  Office  [phone]  CC: Primary care physician; Imelda Pillow, NP

## 2015-02-20 NOTE — Care Management Note (Addendum)
Case Management Note  Patient Details  Name: Lacey Smith MRN: [id number] Date of Birth: 1929/01/29  Subjective/Objective:                 Patient admitted with home with proximal tib fx and 5th metatarsal fx. Patient lives at home alone.  Son lives in [place], and daughter lives in [place].  Patient uses [facility] on [address].  Patient does not have any home medical equipment.  Prior to accident patient drove her self where needed. Per PT recommendation, and patient request patient would like to go to rehab.  Patient is currently in observation status, and has Medicare.    Action/Plan: Social work to see patient to evaluate  Expected Discharge Date:                  Expected Discharge Plan:     In-House Referral:     Discharge planning Services     Post Acute Care Choice:    Choice offered to:     DME Arranged:    DME Agency:     HH Arranged:    HH Agency:     Status of Service:     Medicare Important Message Given:    Date Medicare IM Given:    Medicare IM give by:    Date Additional Medicare IM Given:    Additional Medicare Important Message give by:     If discussed at Long Length of Stay Meetings, dates discussed:    Additional Comments:  Chapman Fitch, RN 02/20/2015, 3:03 PM

## 2015-02-21 DIAGNOSIS — Z9660 Presence of unspecified orthopedic joint implant: Secondary | ICD-10-CM | POA: Diagnosis not present

## 2015-02-21 DIAGNOSIS — S92351A Displaced fracture of fifth metatarsal bone, right foot, initial encounter for closed fracture: Secondary | ICD-10-CM | POA: Diagnosis not present

## 2015-02-21 DIAGNOSIS — S92301A Fracture of unspecified metatarsal bone(s), right foot, initial encounter for closed fracture: Secondary | ICD-10-CM | POA: Diagnosis present

## 2015-02-21 DIAGNOSIS — Z87891 Personal history of nicotine dependence: Secondary | ICD-10-CM | POA: Diagnosis not present

## 2015-02-21 DIAGNOSIS — F419 Anxiety disorder, unspecified: Secondary | ICD-10-CM | POA: Diagnosis not present

## 2015-02-21 DIAGNOSIS — S82101A Unspecified fracture of upper end of right tibia, initial encounter for closed fracture: Secondary | ICD-10-CM | POA: Diagnosis present

## 2015-02-21 DIAGNOSIS — W109XXA Fall (on) (from) unspecified stairs and steps, initial encounter: Secondary | ICD-10-CM | POA: Diagnosis not present

## 2015-02-21 DIAGNOSIS — N184 Chronic kidney disease, stage 4 (severe): Secondary | ICD-10-CM | POA: Diagnosis not present

## 2015-02-21 DIAGNOSIS — Z66 Do not resuscitate: Secondary | ICD-10-CM | POA: Diagnosis not present

## 2015-02-21 DIAGNOSIS — I129 Hypertensive chronic kidney disease with stage 1 through stage 4 chronic kidney disease, or unspecified chronic kidney disease: Secondary | ICD-10-CM | POA: Diagnosis not present

## 2015-02-21 DIAGNOSIS — D649 Anemia, unspecified: Secondary | ICD-10-CM | POA: Diagnosis not present

## 2015-02-21 DIAGNOSIS — Z882 Allergy status to sulfonamides status: Secondary | ICD-10-CM | POA: Diagnosis not present

## 2015-02-21 DIAGNOSIS — I2510 Atherosclerotic heart disease of native coronary artery without angina pectoris: Secondary | ICD-10-CM | POA: Diagnosis not present

## 2015-02-21 DIAGNOSIS — E781 Pure hyperglyceridemia: Secondary | ICD-10-CM | POA: Diagnosis not present

## 2015-02-21 DIAGNOSIS — D72829 Elevated white blood cell count, unspecified: Secondary | ICD-10-CM | POA: Diagnosis not present

## 2015-02-21 DIAGNOSIS — Z8249 Family history of ischemic heart disease and other diseases of the circulatory system: Secondary | ICD-10-CM | POA: Diagnosis not present

## 2015-02-21 LAB — CBC
HEMATOCRIT: 26.9 % — AB (ref 35.0–47.0)
Hemoglobin: 8.9 g/dL — ABNORMAL LOW (ref 12.0–16.0)
MCH: 30.1 pg (ref 26.0–34.0)
MCHC: 32.9 g/dL (ref 32.0–36.0)
MCV: 91.4 fL (ref 80.0–100.0)
PLATELETS: 224 10*3/uL (ref 150–440)
RBC: 2.95 MIL/uL — ABNORMAL LOW (ref 3.80–5.20)
RDW: 15.3 % — ABNORMAL HIGH (ref 11.5–14.5)
WBC: 8 10*3/uL (ref 3.6–11.0)

## 2015-02-21 MED ORDER — CLONIDINE HCL 0.1 MG PO TABS
0.1000 mg | ORAL_TABLET | Freq: Once | ORAL | Status: AC
  Administered 2015-02-21: 0.1 mg via ORAL
  Filled 2015-02-21: qty 1

## 2015-02-21 MED ORDER — CLONIDINE HCL 0.1 MG PO TABS
0.2000 mg | ORAL_TABLET | Freq: Two times a day (BID) | ORAL | Status: DC
  Administered 2015-02-21 – 2015-02-24 (×6): 0.2 mg via ORAL
  Filled 2015-02-21 (×6): qty 2

## 2015-02-21 NOTE — Progress Notes (Signed)
Case sent to Med Management for review of status. Per Dr. Osvaldo Human Dalton-Smith she recommended inpatient status.    Lacey Smith, Lacey Smith MR: [id number]  Hosp Acct: [id number] CSN: [id number] Admit Date: 02/19/2015     Date of Birth: Aug 23, 1928 Date of PA Recommendation:   02/20/2015 Admitting/Attending: Katharina Caper MD Admitting Diagnosis: Tibia Fracture with Possible Fibular Fracture, Fifth Metatarsal Fracture Patient Status Report __X__  Inpatient                    _____  Outpatient   Physician Advisor Rationale:  This patient is an 81 year old female who presented to the Emergency Department on 02/19/15, care beginning at 18:22, s/p fall when she slipped on wet stones. She complained of right lower extremity pain. Her vitals included BP of 158/73. Imaging showed nondisplaced oblique fracture of tibia and right fifth toe. She had an elevated WBC. Her past medical history includes hypertension, chronic kidney disease and hyperlipidemia. Her plan of care includes Orthopedic evaluation, pain management, trending labs, Physical therapy and close clinical monitoring.   An Inpatient stay is recommended.   The execution of the patient's plan of care is expected to require hospital care that will span at least two midnights because of time needed to stabilize pain and mobilize with PT.

## 2015-02-21 NOTE — Progress Notes (Signed)
Subjective:  Hospital day #2 for right proximal tibia fracture and right fifth metatarsal fracture. Patient's pain is responding to pain medication. Patient is having trouble getting approval to go to skilled nursing facility with her insurance. She is very concerned about going home. She worries about her safety getting around at home.  Objective:   VITALS:   Filed Vitals:   02/20/15 2038 02/21/15 0520 02/21/15 0800 02/21/15 1119  BP: 175/77 182/65 186/61 121/60  Pulse: 76 73 73 66  Temp: 98.4 F (36.9 C) 98.5 F (36.9 C) 98.4 F (36.9 C) 97.8 F (36.6 C)  TempSrc: Oral Oral Oral Oral  Resp: Height:      Weight:      SpO2: 98% 97% 98% 98%    PHYSICAL EXAM: Patient continues to have tenderness over the proximal right tibia and right foot. She has ecchymosis and swelling in these areas. She has palpable pedal pulses and intact sensation light touch. She has intact motor function in the right lower extremity. Her leg and foot compartments remain soft and compressible.   LABS  Results for orders placed or performed during the hospital encounter of 02/19/15 (from the past 24 hour(s))  CBC     Status: Abnormal   Collection Time: 02/21/15  5:06 AM  Result Value Ref Range   WBC 8.0 3.6 - 11.0 K/uL   RBC 2.95 (L) 3.80 - 5.20 MIL/uL   Hemoglobin 8.9 (L) 12.0 - 16.0 g/dL   HCT 16.1 (L) 09.6 - 04.5 %   MCV 91.4 80.0 - 100.0 fL   MCH 30.1 26.0 - 34.0 pg   MCHC 32.9 32.0 - 36.0 g/dL   RDW 40.9 (H) 81.1 - 91.4 %   Platelets 224 150 - 440 K/uL    Dg Tibia/fibula Right  02/19/2015   CLINICAL DATA:  Acute right lower leg pain following fall today. Initial encounter.  EXAM: RIGHT TIBIA AND FIBULA - 2 VIEW  COMPARISON:  None.  FINDINGS: A nondisplaced oblique fracture of the proximal tibia is seen only on the lateral view is noted.  A possible nondisplaced fracture of the fibular head is present.  There is no evidence of subluxation or dislocation.  IMPRESSION: Nondisplaced  oblique fracture of the proximal tibia and possible nondisplaced fracture of the fibular head.   Electronically Signed   By: Harmon Pier M.D.   On: 02/19/2015 16:01   Ct Head Wo Contrast  02/19/2015   CLINICAL DATA:  The patient was walking down stone steps and tripped and fell around 0820 this AM. Pt has swelling and bruising to right knee and foot. Pt has abrasion noted to left knee and shin. Pt also has hematoma noted to right side of forehead.  EXAM: CT HEAD WITHOUT CONTRAST  TECHNIQUE: Contiguous axial images were obtained from the base of the skull through the vertex without intravenous contrast.  COMPARISON:  None.  FINDINGS: There is central and cortical atrophy. Periventricular white matter changes are consistent with small vessel disease. There is no intra or extra-axial fluid collection or mass lesion. The basilar cisterns and ventricles have a normal appearance. There is no CT evidence for acute infarction or hemorrhage. Bone windows show right frontal scalp edema not associated with underlying calvarial fracture. Images are degraded by patient motion artifact.  IMPRESSION: 1.  No evidence for acute intracranial abnormality. 2. Atrophy and small vessel disease. 3. Right frontal scalp edema.   Electronically Signed   By: Norva Pavlov M.D.  On: 02/19/2015 17:56   Dg Foot Complete Right  02/19/2015   CLINICAL DATA:  Lateral foot pain after falling today. Initial encounter.  EXAM: RIGHT FOOT COMPLETE - 3+ VIEW  COMPARISON:  None.  FINDINGS: The bones are demineralized. There is a nondisplaced fracture of the fifth metatarsal base which probably demonstrates intra-articular extension. The Lisfranc alignment is normal. There are moderate degenerative changes at the first metatarsal phalangeal joint with a hallux valgus formation.  IMPRESSION: Nondisplaced intra-articular fracture of the fifth metatarsal base.   Electronically Signed   By: Carey Bullocks M.D.   On: 02/19/2015 15:58     Assessment/Plan:     Active Problems:   Tibia fracture  Patient has sustained 2 fractures in the right lower extremity after a fall. The patient has pain and difficulty with transferring from bed to chair. She has had difficulty with physical therapy.  I feel that given the patient's age and multiple injuries she sustained that she would be best served with a rehabilitation stay at a skilled nursing facility. I do have concerns about her upper extremity strength and balance and feel that she has a high fall risk. I do not feel it is safe for her to go home. Patient should continue physical therapy while here at [facility]. She must remain nonweightbearing on the right lower extremity given her injuries. She should continue to ice and elevate her right lower extremity. She should continue have neurovascular checks. I will continue to follow the patient while she is here in the hospital and she will follow-up in my office after discharge in approximately 10-14 days.    Juanell Fairly , MD 02/21/2015, 1:00 PM

## 2015-02-21 NOTE — Progress Notes (Addendum)
[facility] - [place] at [facility]   PATIENT NAME: Lacey Smith    MR#:  [id number]  DATE OF BIRTH:  12/16/1928  SUBJECTIVE:  CHIEF COMPLAINT:   Chief Complaint  Patient presents with  . Fall  pt has bed offerers, but would have to pay out of pocket, she states she is nauseated and unable to get up and move around. She feels she is not ready to go to snf. And states she refuses to go anywhere today.      Review of Systems  Constitutional: Negative for fever, chills and weight loss.  HENT: Negative for congestion.   Eyes: Negative for blurred vision and double vision.  Respiratory: Negative for cough, sputum production, shortness of breath and wheezing.   Cardiovascular: Negative for chest pain, palpitations, orthopnea, leg swelling and PND.  Gastrointestinal: Positive for nausea and vomiting. Negative for abdominal pain, diarrhea, constipation and blood in stool.  Genitourinary: Negative for dysuria, urgency, frequency and hematuria.  Musculoskeletal: Negative for falls.  Neurological: Negative for dizziness, tremors, focal weakness and headaches.  Endo/Heme/Allergies: Does not bruise/bleed easily.  Psychiatric/Behavioral: Negative for depression. The patient does not have insomnia.     VITAL SIGNS: Blood pressure 186/61, pulse 73, temperature 98.4 F (36.9 C), temperature source Oral, resp. rate 18, height  (1.651 m), weight 71.305 kg (157 lb 3.2 oz), SpO2 98 %.  PHYSICAL EXAMINATION:   GENERAL:  81 y.o.-year-old patient lying in the bed with no acute distress.  EYES: Pupils equal, round, reactive to light and accommodation. No scleral icterus. Extraocular muscles intact.  HEENT: Head atraumatic, normocephalic. Oropharynx and nasopharynx clear.  NECK:  Supple, no jugular venous distention. No thyroid enlargement, no tenderness.  LUNGS: Normal breath sounds bilaterally, no wheezing, rales,rhonchi or crepitation. No use of accessory muscles  of respiration.  CARDIOVASCULAR: S1, S2 normal. No murmurs, rubs, or gallops.  ABDOMEN: Soft, nontender, nondistended. Bowel sounds present. No organomegaly or mass.  EXTREMITIES: No pedal edema, cyanosis, or clubbing.  right lower extremity tibial area is swollen, few scratches were noted of the skin. Left lower extremity has dressing on few skin scrapes NEUROLOGIC: Cranial nerves II through XII are intact. Muscle strength 5/5 in all extremities. Sensation intact. Gait not checked.  PSYCHIATRIC: The patient is alert and oriented x 3.  SKIN: No obvious rash, lesion, or ulcer.   ORDERS/RESULTS REVIEWED:   CBC  Recent Labs Lab 02/19/15 1825 02/20/15 0433 02/21/15 0506  WBC 11.5* 8.5 8.0  HGB 10.0* 9.9* 8.9*  HCT 30.2* 29.9* 26.9*  PLT 311 250 224  MCV 91.6 92.0 91.4  MCH 30.3 30.3 30.1  MCHC 33.1 33.0 32.9  RDW 15.7* 16.1* 15.3*  LYMPHSABS 1.5  --   --   MONOABS 0.9  --   --   EOSABS 0.2  --   --   BASOSABS 0.1  --   --    ------------------------------------------------------------------------------------------------------------------  Chemistries   Recent Labs Lab 02/19/15 1825 02/20/15 0433  NA 140 142  K 5.0 4.0  CL 107 109  CO2 23 25  GLUCOSE 111* 99  BUN 47* 45*  CREATININE 2.38* 2.34*  CALCIUM 9.8 9.5   ------------------------------------------------------------------------------------------------------------------ estimated creatinine clearance is 18 mL/min (by C-G formula based on Cr of 2.34). ------------------------------------------------------------------------------------------------------------------ No results for input(s): TSH, T4TOTAL, T3FREE, THYROIDAB in the last 72 hours.  Invalid input(s): FREET3  Cardiac Enzymes No results for input(s): CKMB, TROPONINI, MYOGLOBIN in the last 168 hours.  Invalid input(s): CK ------------------------------------------------------------------------------------------------------------------ Invalid  input(s): POCBNP ---------------------------------------------------------------------------------------------------------------  RADIOLOGY: Dg Tibia/fibula Right  02/19/2015   CLINICAL DATA:  Acute right lower leg pain following fall today. Initial encounter.  EXAM: RIGHT TIBIA AND FIBULA - 2 VIEW  COMPARISON:  None.  FINDINGS: A nondisplaced oblique fracture of the proximal tibia is seen only on the lateral view is noted.  A possible nondisplaced fracture of the fibular head is present.  There is no evidence of subluxation or dislocation.  IMPRESSION: Nondisplaced oblique fracture of the proximal tibia and possible nondisplaced fracture of the fibular head.   Electronically Signed   By: Harmon Pier M.D.   On: 02/19/2015 16:01   Ct Head Wo Contrast  02/19/2015   CLINICAL DATA:  The patient was walking down stone steps and tripped and fell around 0820 this AM. Pt has swelling and bruising to right knee and foot. Pt has abrasion noted to left knee and shin. Pt also has hematoma noted to right side of forehead.  EXAM: CT HEAD WITHOUT CONTRAST  TECHNIQUE: Contiguous axial images were obtained from the base of the skull through the vertex without intravenous contrast.  COMPARISON:  None.  FINDINGS: There is central and cortical atrophy. Periventricular white matter changes are consistent with small vessel disease. There is no intra or extra-axial fluid collection or mass lesion. The basilar cisterns and ventricles have a normal appearance. There is no CT evidence for acute infarction or hemorrhage. Bone windows show right frontal scalp edema not associated with underlying calvarial fracture. Images are degraded by patient motion artifact.  IMPRESSION: 1.  No evidence for acute intracranial abnormality. 2. Atrophy and small vessel disease. 3. Right frontal scalp edema.   Electronically Signed   By: Norva Pavlov M.D.   On: 02/19/2015 17:56   Dg Foot Complete Right  02/19/2015   CLINICAL DATA:  Lateral foot  pain after falling today. Initial encounter.  EXAM: RIGHT FOOT COMPLETE - 3+ VIEW  COMPARISON:  None.  FINDINGS: The bones are demineralized. There is a nondisplaced fracture of the fifth metatarsal base which probably demonstrates intra-articular extension. The Lisfranc alignment is normal. There are moderate degenerative changes at the first metatarsal phalangeal joint with a hallux valgus formation.  IMPRESSION: Nondisplaced intra-articular fracture of the fifth metatarsal base.   Electronically Signed   By: Carey Bullocks M.D.   On: 02/19/2015 15:58    EKG: No orders found for this or any previous visit.  ASSESSMENT AND PLAN:  Active Problems:   Tibia fracture 1. Tibial fracture, continue immobilizer per orthopedic surgeon's recommendation as well as pain management with tramadol and oxycodone, snf discharge  2. Nausea, vomiting. Initiate patient on Zofran as well as Phenergan as needed 3. Malignant essential hypertension. Increase clondine 4. CK D. Stable following  Renal fxn 5. Leukocytosis, likely stress related, resolved 6. Anemia. Likely acd   D/w daugthter and patient. They want patient to refer to inpatient rehab at [facility]   DRUG ALLERGIES:  Allergies  Allergen Reactions  . Sulfa Antibiotics     CODE STATUS:     Code Status Orders        Start     Ordered   02/19/15 1906  Do not attempt resuscitation (DNR)   Continuous    Question Answer Comment  In the event of cardiac or respiratory ARREST Do not call a "code blue"   In the event of cardiac or respiratory ARREST Do not perform Intubation, CPR, defibrillation or ACLS   In the event of cardiac  or respiratory ARREST Use medication by any route, position, wound care, and other measures to relive pain and suffering. May use oxygen, suction and manual treatment of airway obstruction as needed for comfort.   Comments nurse may pronounce      02/19/15 1905    Advance Directive Documentation        Most Recent  Value   Type of Advance Directive  Living will   Pre-existing out of facility DNR order (yellow form or pink MOST form)     "MOST" Form in Place?        TOTAL TIME TAKING CARE OF  Patient  Case discussed with the patient and her daughter   Auburn Bilberry M.D on 02/21/2015 at 9:29 AM  Between 7am to 6pm - Pager - [phone]  After 6pm go to [email or web address] - password [email or web address] [facility]  [facility]  Office  [phone]  CC: Primary care physician; Imelda Pillow, NP

## 2015-02-21 NOTE — Progress Notes (Signed)
Patient concerned about her insurance not covering her rehab period. States she can not go home it would be very unsafe. Patient coperative and pleasant . Pain med given twice without nausea.

## 2015-02-21 NOTE — Progress Notes (Signed)
   02/21/15 1300  PT Visit Information  Reason Eval/Treat Not Completed Other (comment) (Refused; discharge planning with family; upset/frustrated)   Will re-attempt if time allows. RN notified.

## 2015-02-22 MED ORDER — ENSURE ENLIVE PO LIQD
237.0000 mL | Freq: Three times a day (TID) | ORAL | Status: DC
  Administered 2015-02-22 – 2015-02-24 (×4): 237 mL via ORAL

## 2015-02-22 NOTE — Progress Notes (Signed)
[facility] - [place] at [facility]   PATIENT NAME: Lacey Smith    MR#:  [id number]  DATE OF BIRTH:  [birth year]  SUBJECTIVE:  CHIEF COMPLAINT:   Chief Complaint  Patient presents with  . Fall   complains of pain in her leg also some nausea   Review of Systems  Constitutional: Negative for fever, chills and weight loss.  HENT: Negative for congestion.   Eyes: Negative for blurred vision and double vision.  Respiratory: Negative for cough, sputum production, shortness of breath and wheezing.   Cardiovascular: Negative for chest pain, palpitations, orthopnea, leg swelling and PND.  Gastrointestinal: Positive for nausea  Negative for abdominal pain, diarrhea, constipation and blood in stool.  Genitourinary: Negative for dysuria, urgency, frequency and hematuria.  Musculoskeletal: Negative for falls.  Neurological: Negative for dizziness, tremors, focal weakness and headaches.  Endo/Heme/Allergies: Does not bruise/bleed easily.  Psychiatric/Behavioral: Negative for depression. The patient does not have insomnia.     VITAL SIGNS: Blood pressure 136/96, pulse 61, temperature 98.1 F (36.7 C), temperature source Oral, resp. rate 18, height  (1.651 m), weight 71.305 kg (157 lb 3.2 oz), SpO2 97 %.  PHYSICAL EXAMINATION:   GENERAL:  81 y.o.-year-old patient lying in the bed with no acute distress.  EYES: Pupils equal, round, reactive to light and accommodation. No scleral icterus. Extraocular muscles intact.  HEENT: Head atraumatic, normocephalic. Oropharynx and nasopharynx clear.  NECK:  Supple, no jugular venous distention. No thyroid enlargement, no tenderness.  LUNGS: Normal breath sounds bilaterally, no wheezing, rales,rhonchi or crepitation. No use of accessory muscles of respiration.  CARDIOVASCULAR: S1, S2 normal. No murmurs, rubs, or gallops.  ABDOMEN: Soft, nontender, nondistended. Bowel sounds present. No organomegaly or mass.  EXTREMITIES:  No pedal edema, cyanosis, or clubbing.  right lower extremity tibial area is swollen, few scratches were noted of the skin. Left lower extremity has dressing on few skin scrapes NEUROLOGIC: Cranial nerves II through XII are intact. Muscle strength 5/5 in all extremities. Sensation intact. Gait not checked.  PSYCHIATRIC: The patient is alert and oriented x 3.  SKIN: No obvious rash, lesion, or ulcer.   ORDERS/RESULTS REVIEWED:   CBC  Recent Labs Lab 02/19/15 1825 02/20/15 0433 02/21/15 0506  WBC 11.5* 8.5 8.0  HGB 10.0* 9.9* 8.9*  HCT 30.2* 29.9* 26.9*  PLT 311 250 224  MCV 91.6 92.0 91.4  MCH 30.3 30.3 30.1  MCHC 33.1 33.0 32.9  RDW 15.7* 16.1* 15.3*  LYMPHSABS 1.5  --   --   MONOABS 0.9  --   --   EOSABS 0.2  --   --   BASOSABS 0.1  --   --    ------------------------------------------------------------------------------------------------------------------  Chemistries   Recent Labs Lab 02/19/15 1825 02/20/15 0433  NA 140 142  K 5.0 4.0  CL 107 109  CO2 23 25  GLUCOSE 111* 99  BUN 47* 45*  CREATININE 2.38* 2.34*  CALCIUM 9.8 9.5   ------------------------------------------------------------------------------------------------------------------ estimated creatinine clearance is 18 mL/min (by C-G formula based on Cr of 2.34). ------------------------------------------------------------------------------------------------------------------ No results for input(s): TSH, T4TOTAL, T3FREE, THYROIDAB in the last 72 hours.  Invalid input(s): FREET3  Cardiac Enzymes No results for input(s): CKMB, TROPONINI, MYOGLOBIN in the last 168 hours.  Invalid input(s): CK ------------------------------------------------------------------------------------------------------------------ Invalid input(s): POCBNP ---------------------------------------------------------------------------------------------------------------  RADIOLOGY: No results found.  EKG: No orders found for  this or any previous visit.  ASSESSMENT AND PLAN:  Active Problems:   Tibia fracture 1. Tibial fracture, continue immobilizer per  orthopedic surgeon's recommendation as well as pain management with tramadol and oxycodone, snf discharge  2. Nausea, continue Zofran and Phenergan as needed 3. Malignant essential hypertension. Increase clondine improved with current treatment 4. CKD stage IV. Stable following  Renal fxn 5. Leukocytosis, likely stress related, resolved 6. Anemia. Likely acd   D/w daugthter and patient. They want patient to refer to inpatient rehab at [facility]   DRUG ALLERGIES:  Allergies  Allergen Reactions  . Sulfa Antibiotics     CODE STATUS:     Code Status Orders        Start     Ordered   02/19/15 1906  Do not attempt resuscitation (DNR)   Continuous    Question Answer Comment  In the event of cardiac or respiratory ARREST Do not call a "code blue"   In the event of cardiac or respiratory ARREST Do not perform Intubation, CPR, defibrillation or ACLS   In the event of cardiac or respiratory ARREST Use medication by any route, position, wound care, and other measures to relive pain and suffering. May use oxygen, suction and manual treatment of airway obstruction as needed for comfort.   Comments nurse may pronounce      02/19/15 1905    Advance Directive Documentation        Most Recent Value   Type of Advance Directive  Living will   Pre-existing out of facility DNR order (yellow form or pink MOST form)     "MOST" Form in Place?        TOTAL TIME TAKING CARE OF  Patient 49   Allena Katz [facility], [place] M.D on 02/22/2015 at 11:40 AM  Between 7am to 6pm - Pager - [phone]  After 6pm go to [email or web address] - password [email or web address] [email or web address]  [place] [facility]  Office  [phone]  CC: Primary care physician; Imelda Pillow, NP

## 2015-02-22 NOTE — Progress Notes (Signed)
Physical Therapy Treatment Patient Details Name: Lacey Smith MRN: [id number] DOB: 01/25/1929 Today's Date: 02/22/2015    History of Present Illness Pt fell down steps, suffered non-displaced L proximal tib fx and 5th metatarsal fx    PT Comments    Pt calmer today and agreeable to PT session.  Pt with good pain control and some dizziness with standing that resolved with seated rest break x3 min.  Pt able to get from bed to chair with RW and Min A.  Pt demonstrated decreased balance with transfer and gait and needs Min A for safety.  D/C recs continue to remain appropriate for this patient.  Pt is progressing well with POC.  Follow Up Recommendations  SNF     Equipment Recommendations  Rolling walker with 5" wheels    Recommendations for Other Services       Precautions / Restrictions Precautions Precautions: Fall Restrictions Weight Bearing Restrictions: Yes LLE Weight Bearing: Non weight bearing    Mobility  Bed Mobility Overal bed mobility: Modified Independent Bed Mobility: Supine to Sit     Supine to sit: Modified independent (Device/Increase time)     General bed mobility comments: HOB elevated and exiting to R side.  Verbal cues for sequencing.  Transfers Overall transfer level: Needs assistance Equipment used: Standard walker Transfers: Sit to/from Stand Sit to Stand: Min guard         General transfer comment: Fearful of standing and nervous about safety of walker.  Talked pt through each step of transfer as well as demonstrated transfer to pt.   Ambulation/Gait Ambulation/Gait assistance: Min assist Ambulation Distance (Feet): 3 Feet Assistive device: Standard walker       General Gait Details: Bed to chair taking several small hops in addition to pivoting on R LE to get hips aligned with chair.  Impaired ablilty to back up towards chair   Stairs            Wheelchair Mobility    Modified Rankin (Stroke Patients Only)        Balance Overall balance assessment: Needs assistance         Standing balance support: Bilateral upper extremity supported Standing balance-Leahy Scale: Fair                      Cognition Arousal/Alertness: Awake/alert Behavior During Therapy: WFL for tasks assessed/performed Overall Cognitive Status: Within Functional Limits for tasks assessed                      Exercises General Exercises - Lower Extremity Ankle Circles/Pumps: Both;15 reps;Supine Quad Sets: Both;20 reps;Supine Heel Slides: Left;15 reps;Supine Hip ABduction/ADduction: Right;15 reps;Supine Straight Leg Raises: Right;10 reps;Supine    General Comments        Pertinent Vitals/Pain Pain Assessment: No/denies pain    Home Living                      Prior Function            PT Goals (current goals can now be found in the care plan section) Acute Rehab PT Goals Patient Stated Goal: "I am going to go to rehab, I just can't go home." Progress towards PT goals: Progressing toward goals    Frequency  Min 2X/week    PT Plan Current plan remains appropriate    Co-evaluation             End of Session Equipment Utilized During Treatment:  Gait belt;Right knee immobilizer Activity Tolerance: Patient limited by fatigue Patient left: in chair;with call bell/phone within reach     Time: 1208-1258 PT Time Calculation (min) (ACUTE ONLY): 50 min  Charges:  $Gait Training: 23-37 mins $Therapeutic Exercise: 8-22 mins                    G Codes:      Gisele A Long 2015-03-16, 1:33 PM

## 2015-02-23 MED ORDER — AMLODIPINE BESYLATE 10 MG PO TABS
10.0000 mg | ORAL_TABLET | Freq: Every day | ORAL | Status: DC
  Administered 2015-02-24: 10 mg via ORAL
  Filled 2015-02-23: qty 1

## 2015-02-23 MED ORDER — DOCUSATE SODIUM 100 MG PO CAPS
200.0000 mg | ORAL_CAPSULE | Freq: Two times a day (BID) | ORAL | Status: DC
  Administered 2015-02-23 – 2015-02-24 (×3): 200 mg via ORAL
  Filled 2015-02-23 (×3): qty 2

## 2015-02-23 MED ORDER — ALUM & MAG HYDROXIDE-SIMETH 200-200-20 MG/5ML PO SUSP: 30.0000 mL | Freq: Four times a day (QID) | ORAL | Status: DC | PRN

## 2015-02-23 NOTE — Clinical Social Work Placement (Signed)
   CLINICAL SOCIAL WORK PLACEMENT  NOTE  Date:  02/23/2015  Patient Details  Name: Lacey Smith MRN: [id number] Date of Birth: July 12, 1928  Clinical Social Work is seeking post-discharge placement for this patient at the Skilled  Nursing Facility level of care (*CSW will initial, date and re-position this form in  chart as items are completed):  Yes   Patient/family provided with [place] Clinical Social Work Department's list of facilities offering this level of care within the geographic area requested by the patient (or if unable, by the patient's family).  Yes   Patient/family informed of their freedom to choose among providers that offer the needed level of care, that participate in Medicare, Medicaid or managed care program needed by the patient, have an available bed and are willing to accept the patient.  Yes   Patient/family informed of [place]'s ownership interest in [facility] and [facility], as well as of the fact that they are under no obligation to receive care at these facilities.  PASRR submitted to EDS on 02/20/15     PASRR number received on 02/20/15     Existing PASRR number confirmed on       FL2 transmitted to all facilities in geographic area requested by pt/family on 02/20/15     FL2 transmitted to all facilities within larger geographic area on       Patient informed that his/her managed care company has contracts with or will negotiate with certain facilities, including the following:        Yes   Patient/family informed of bed offers received.  Patient chooses bed at  [facility] )     Physician recommends and patient chooses bed at      Patient to be transferred to   on  .  Patient to be transferred to facility by       Patient family notified on   of transfer.  Name of family member notified:        PHYSICIAN Please sign FL2, Please sign DNR     Additional Comment:     _______________________________________________ Haig Prophet, LCSW 02/23/2015, 1:45 PM

## 2015-02-23 NOTE — Progress Notes (Signed)
  Subjective:  Patient is seen in her hospital bed this evening. Her pain is controlled as long as she is not moving her leg. She remains in a knee immobilizer. Her leg is elevated. Patient is continued to work with physical therapy. He states she lacks strength in her upper extremities and that using a walker is difficult and exhausting.  Objective:   VITALS:   Filed Vitals:   02/23/15 0752 02/23/15 0942 02/23/15 1136 02/23/15 1518  BP: 194/73  99/52 122/47  Pulse: 69 59 58 59  Temp: 98.2 F (36.8 C)  98.1 F (36.7 C) 98.5 F (36.9 C)  TempSrc: Oral  Oral Oral  Resp: Height:      Weight:      SpO2: 100% 98% 99% 100%    PHYSICAL EXAM:  Examination the right lower extremity reveals skin overlying the right knee remains intact but there is still ecchymosis and swelling. She is tender over the proximal tibia. Patient has a healing superficial abrasion over the dorsum of her foot. There is no evidence of infection. There is significant ecchymosis over the dorsolateral foot as well with tenderness over the fifth metatarsal. She has intact sensation light touch throughout her right lower extremity.  Her leg and foot compartments are soft and compressible. She has palpable pedal pulses and intact motor function in the right lower extremity.   LABS  No results found for this or any previous visit (from the past 24 hour(s)).  No results found.  Assessment/Plan:     Active Problems:   Tibia fracture  Patient has been approved for a skilled nursing facility upon discharge. Plan is for discharge tomorrow. Continue physical therapy in the meantime. Patient will follow up with me in 10-14 days after discharge in my office for reevaluation and x-ray.    Juanell Fairly , MD 02/23/2015, 6:56 PM

## 2015-02-23 NOTE — Progress Notes (Signed)
Physical Therapy Treatment Patient Details Name: Lacey Smith MRN: [id number] DOB: 20-Dec-1928 Today's Date: 02/23/2015    History of Present Illness Pt fell down steps, suffered non-displaced L proximal tib fx and 5th metatarsal fx    PT Comments    Pt progressing well with LE exercises; has several question answered to patients satisfaction and provided with written program. Pt demonstrates good bed mobility with increased time. Pt educated on proper/safe sequence for sit to/from stand to protect non weightbearing status on RLE.; able to demonstrate with cues. Pt continues to have difficulty with transfer/ambulation as pt only able to pivot well on LLE; demonstrates several small hops back to chair with decreased balance/safety requiring assist. Continue PT to progress strength, balance, safety to improve overall standing functional mobility.   Follow Up Recommendations  SNF     Equipment Recommendations  Rolling walker with 5" wheels    Recommendations for Other Services       Precautions / Restrictions Restrictions Weight Bearing Restrictions: Yes LLE Weight Bearing: Non weight bearing    Mobility  Bed Mobility Overal bed mobility: Modified Independent Bed Mobility: Supine to Sit     Supine to sit: Modified independent (Device/Increase time)     General bed mobility comments:  (slight increased time especially scooting edge of bed)  Transfers Overall transfer level: Needs assistance Equipment used: Standard walker Transfers: Sit to/from Stand;Stand Pivot Transfers Sit to Stand: Min guard (cues/education on technique) Stand pivot transfers: Min guard;Min assist       General transfer comment: Min guard while pivoting; Min A for balance with small hops backward toward chair  Ambulation/Gait Ambulation/Gait assistance: Min assist Ambulation Distance (Feet): 3 Feet Assistive device: Standard walker Gait Pattern/deviations:  (several small hops back to chair)      General Gait Details:  (Difficulty hopping backward to chair; assist for balance)   Stairs            Wheelchair Mobility    Modified Rankin (Stroke Patients Only)       Balance Overall balance assessment: Needs assistance         Standing balance support: Bilateral upper extremity supported Standing balance-Leahy Scale: Fair                      Cognition Arousal/Alertness: Awake/alert Behavior During Therapy: WFL for tasks assessed/performed Overall Cognitive Status: Within Functional Limits for tasks assessed                      Exercises General Exercises - Lower Extremity Ankle Circles/Pumps: AROM;Both;20 reps;Supine Quad Sets: Strengthening;Both;20 reps;Supine Gluteal Sets: Strengthening;Both;20 reps;Supine Long Arc Quad: AROM;Left;20 reps;Seated Heel Slides: AROM;Left;20 reps;Supine (Resist in extension) Hip ABduction/ADduction: AROM;Both;20 reps;Supine Straight Leg Raises: AROM;Both;20 reps;Supine (min support provided to R) Hip Flexion/Marching: Left;AROM;20 reps;Seated    General Comments        Pertinent Vitals/Pain      Home Living                      Prior Function            PT Goals (current goals can now be found in the care plan section) Progress towards PT goals: Progressing toward goals    Frequency  Min 2X/week    PT Plan Current plan remains appropriate    Co-evaluation             End of Session Equipment Utilized During Treatment: Gait belt;Right knee immobilizer  Activity Tolerance: Patient tolerated treatment well Patient left: in chair;with call bell/phone within reach;with chair alarm set     Time: 1610-9604 PT Time Calculation (min) (ACUTE ONLY): 38 min  Charges:  $Gait Training: 8-22 mins $Therapeutic Exercise: 23-37 mins                    G Codes:      Kristeen Miss 02/23/2015, 11:53 AM

## 2015-02-23 NOTE — Care Management Important Message (Signed)
Important Message  Patient Details  Name: Lacey Smith MRN: [id number] Date of Birth: 1929/03/14   Medicare Important Message Given:  Yes-second notification given    Verita Schneiders Allmond 02/23/2015, 10:43 AM

## 2015-02-23 NOTE — Progress Notes (Signed)
[facility] inpatient rehab - I was asked to consider patient for acute inpatient rehab admission.  Patient did well with initial transfers requiring minguard assist.  She lacks the medical necessity to support an acute inpatient rehab admission.  Recommend SNF placement.  Call me for questions.  #[phone]

## 2015-02-23 NOTE — Progress Notes (Signed)
Clinical Social Worker (CSW) met with patient to discuss D/C plan. Patient changed to inpatient on 02/21/15. Per MD patient will be medically stable for D/C tomorrow 02/24/15. Patient will meet the 3 night qualifying inpatient stay under medicare. CSW presented bed offers. Patient chose [facility]. Plan is for patient to D/C to [facility] tomorrow. RN is aware of above. CSW will continue to follow and assist as needed.   Blima Rich, [place] [phone]

## 2015-02-23 NOTE — Progress Notes (Addendum)
[facility] [place] [facility]   PATIENT NAME: Lacey Smith    MR#:  [id number]  DATE OF BIRTH:  1928-06-25  SUBJECTIVE:  CHIEF COMPLAINT:   Chief Complaint  Patient presents with  . Fall   complains of pain in her leg nausea no bowel movements yet   Review of Systems  Constitutional: Negative for fever, chills and weight loss.  HENT: Negative for congestion.   Eyes: Negative for blurred vision and double vision.  Respiratory: Negative for cough, sputum production, shortness of breath and wheezing.   Cardiovascular: Negative for chest pain, palpitations, orthopnea, leg swelling and PND.  Gastrointestinal: Positive for nausea  Negative for abdominal pain, diarrhea, constipation and blood in stool.  Genitourinary: Negative for dysuria, urgency, frequency and hematuria.  Musculoskeletal: Negative for falls.  Neurological: Negative for dizziness, tremors, focal weakness and headaches.  Endo/Heme/Allergies: Does not bruise/bleed easily.  Psychiatric/Behavioral: Negative for depression. The patient does not have insomnia.     VITAL SIGNS: Blood pressure 194/73, pulse 59, temperature 98.2 F (36.8 C), temperature source Oral, resp. rate 18, height  (1.651 m), weight 71.305 kg (157 lb 3.2 oz), SpO2 98 %.  PHYSICAL EXAMINATION:   GENERAL:  81 y.o.-year-old patient lying in the bed with no acute distress.  EYES: Pupils equal, round, reactive to light and accommodation. No scleral icterus. Extraocular muscles intact.  HEENT: Head atraumatic, normocephalic. Oropharynx and nasopharynx clear.  NECK:  Supple, no jugular venous distention. No thyroid enlargement, no tenderness.  LUNGS: Normal breath sounds bilaterally, no wheezing, rales,rhonchi or crepitation. No use of accessory muscles of respiration.  CARDIOVASCULAR: S1, S2 normal. No murmurs, rubs, or gallops.  ABDOMEN: Soft, nontender, nondistended. Bowel sounds present. No organomegaly or mass.   EXTREMITIES: No pedal edema, cyanosis, or clubbing.  right lower extremity tibial area is swollen, few scratches were noted of the skin. Left lower extremity has dressing on few skin scrapes NEUROLOGIC: Cranial nerves II through XII are intact. Muscle strength 5/5 in all extremities. Sensation intact. Gait not checked.  PSYCHIATRIC: The patient is alert and oriented x 3.  SKIN: No obvious rash, lesion, or ulcer.   ORDERS/RESULTS REVIEWED:   CBC  Recent Labs Lab 02/19/15 1825 02/20/15 0433 02/21/15 0506  WBC 11.5* 8.5 8.0  HGB 10.0* 9.9* 8.9*  HCT 30.2* 29.9* 26.9*  PLT 311 250 224  MCV 91.6 92.0 91.4  MCH 30.3 30.3 30.1  MCHC 33.1 33.0 32.9  RDW 15.7* 16.1* 15.3*  LYMPHSABS 1.5  --   --   MONOABS 0.9  --   --   EOSABS 0.2  --   --   BASOSABS 0.1  --   --    ------------------------------------------------------------------------------------------------------------------  Chemistries   Recent Labs Lab 02/19/15 1825 02/20/15 0433  NA 140 142  K 5.0 4.0  CL 107 109  CO2 23 25  GLUCOSE 111* 99  BUN 47* 45*  CREATININE 2.38* 2.34*  CALCIUM 9.8 9.5   ------------------------------------------------------------------------------------------------------------------ estimated creatinine clearance is 18 mL/min (by C-G formula based on Cr of 2.34). ------------------------------------------------------------------------------------------------------------------ No results for input(s): TSH, T4TOTAL, T3FREE, THYROIDAB in the last 72 hours.  Invalid input(s): FREET3  Cardiac Enzymes No results for input(s): CKMB, TROPONINI, MYOGLOBIN in the last 168 hours.  Invalid input(s): CK ------------------------------------------------------------------------------------------------------------------ Invalid input(s): POCBNP ---------------------------------------------------------------------------------------------------------------  RADIOLOGY: No results found.  EKG: No  orders found for this or any previous visit.  ASSESSMENT AND PLAN:  Active Problems:   Tibia fracture 1. Tibial fracture, continue  immobilizer per orthopedic surgeon's recommendation as well as pain management with tramadol and oxycodone, snf discharge tomorrow 2. Nausea, continue Zofran and Phenergan as needed and Maalox stool softeners 3. Malignant essential hypertension. Clonidine increased blood pressure still elevated  increased Norvasc dose to 10 continue metoprolol 4. CKD stage IV. Stable following  Renal fxn 5. Leukocytosis, likely stress related, resolved 6. Anemia. Likely acd      DRUG ALLERGIES:  Allergies  Allergen Reactions  . Sulfa Antibiotics     CODE STATUS:     Code Status Orders        Start     Ordered   02/19/15 1906  Do not attempt resuscitation (DNR)   Continuous    Question Answer Comment  In the event of cardiac or respiratory ARREST Do not call a "code blue"   In the event of cardiac or respiratory ARREST Do not perform Intubation, CPR, defibrillation or ACLS   In the event of cardiac or respiratory ARREST Use medication by any route, position, wound care, and other measures to relive pain and suffering. May use oxygen, suction and manual treatment of airway obstruction as needed for comfort.   Comments nurse may pronounce      02/19/15 1905    Advance Directive Documentation        Most Recent Value   Type of Advance Directive  Living will   Pre-existing out of facility DNR order (yellow form or pink MOST form)     "MOST" Form in Place?        TOTAL TIME TAKING CARE OF  Patient  Auburn Bilberry M.D on 02/23/2015 at 11:34 AM  Between 7am to 6pm - Pager - [phone]  After 6pm go to [email or web address] - password [email or web address] [facility]  [facility]  Office  [phone]  CC: Primary care physician; Imelda Pillow, NP

## 2015-02-24 ENCOUNTER — Encounter: Admission: RE | Admit: 2015-02-24 | Payer: Medicare Other | Source: Ambulatory Visit | Admitting: Internal Medicine

## 2015-02-24 LAB — CBC
HEMATOCRIT: 28.5 % — AB (ref 35.0–47.0)
HEMOGLOBIN: 9.3 g/dL — AB (ref 12.0–16.0)
MCH: 29.8 pg (ref 26.0–34.0)
MCHC: 32.6 g/dL (ref 32.0–36.0)
MCV: 91.2 fL (ref 80.0–100.0)
Platelets: 256 10*3/uL (ref 150–440)
RBC: 3.13 MIL/uL — ABNORMAL LOW (ref 3.80–5.20)
RDW: 15.1 % — ABNORMAL HIGH (ref 11.5–14.5)
WBC: 7.3 10*3/uL (ref 3.6–11.0)

## 2015-02-24 MED ORDER — ONDANSETRON HCL 4 MG PO TABS: 4.0000 mg | ORAL_TABLET | Freq: Three times a day (TID) | ORAL | Status: DC | PRN

## 2015-02-24 MED ORDER — CLONIDINE HCL 0.2 MG PO TABS: 0.1000 mg | ORAL_TABLET | Freq: Two times a day (BID) | ORAL | Status: DC

## 2015-02-24 MED ORDER — ALPRAZOLAM 0.25 MG PO TABS: 0.2500 mg | ORAL_TABLET | Freq: Three times a day (TID) | ORAL | Status: DC | PRN

## 2015-02-24 MED ORDER — ALUM & MAG HYDROXIDE-SIMETH 200-200-20 MG/5ML PO SUSP: 30.0000 mL | Freq: Four times a day (QID) | ORAL | Status: DC | PRN

## 2015-02-24 MED ORDER — ASPIRIN 81 MG PO TBEC: 81.0000 mg | DELAYED_RELEASE_TABLET | Freq: Every day | ORAL | Status: DC

## 2015-02-24 MED ORDER — SENNA 8.6 MG PO TABS: 1.0000 | ORAL_TABLET | Freq: Every day | ORAL | Status: DC

## 2015-02-24 MED ORDER — DOCUSATE SODIUM 100 MG PO CAPS: 200.0000 mg | ORAL_CAPSULE | Freq: Two times a day (BID) | ORAL | Status: DC

## 2015-02-24 MED ORDER — ENOXAPARIN SODIUM 40 MG/0.4ML ~~LOC~~ SOLN: 40.0000 mg | SUBCUTANEOUS | Status: DC

## 2015-02-24 MED ORDER — TRAMADOL HCL 50 MG PO TABS: 50.0000 mg | ORAL_TABLET | Freq: Four times a day (QID) | ORAL | Status: DC | PRN

## 2015-02-24 MED ORDER — AMLODIPINE BESYLATE 10 MG PO TABS: 5.0000 mg | ORAL_TABLET | Freq: Every day | ORAL | Status: DC

## 2015-02-24 MED ORDER — OXYCODONE HCL 5 MG PO TABS: 5.0000 mg | ORAL_TABLET | ORAL | Status: DC | PRN

## 2015-02-24 MED ORDER — ACETAMINOPHEN 325 MG PO TABS: 650.0000 mg | ORAL_TABLET | Freq: Four times a day (QID) | ORAL | Status: DC | PRN

## 2015-02-24 NOTE — Clinical Social Work Placement (Signed)
   CLINICAL SOCIAL WORK PLACEMENT  NOTE  Date:  02/24/2015  Patient Details  Name: Lacey Smith MRN: [id number] Date of Birth: [birth year]  Clinical Social Work is seeking post-discharge placement for this patient at the Skilled  Nursing Facility level of care (*CSW will initial, date and re-position this form in  chart as items are completed):  Yes   Patient/family provided with [place] Clinical Social Work Department's list of facilities offering this level of care within the geographic area requested by the patient (or if unable, by the patient's family).  Yes   Patient/family informed of their freedom to choose among providers that offer the needed level of care, that participate in Medicare, Medicaid or managed care program needed by the patient, have an available bed and are willing to accept the patient.  Yes   Patient/family informed of 's ownership interest in [facility] and [facility], as well as of the fact that they are under no obligation to receive care at these facilities.  PASRR submitted to EDS on 02/20/15     PASRR number received on 02/20/15     Existing PASRR number confirmed on       FL2 transmitted to all facilities in geographic area requested by pt/family on 02/20/15     FL2 transmitted to all facilities within larger geographic area on       Patient informed that his/her managed care company has contracts with or will negotiate with certain facilities, including the following:        Yes   Patient/family informed of bed offers received.  Patient chooses bed at  [facility] )     Physician recommends and patient chooses bed at      Patient to be transferred to  [facility] ) on 02/24/15.  Patient to be transferred to facility by  [facility] )     Patient family notified on 02/24/15 of transfer.  Name of family member notified:   (Daughter Dondra Spry is aware of D/C today. )     PHYSICIAN       Additional  Comment:    _______________________________________________ Haig Prophet, LCSW 02/24/2015, 10:10 AM

## 2015-02-24 NOTE — Discharge Summary (Signed)
Trinika Cortese, 81 y.o., DOB [birth year], MRN [id number]. Admission date: 02/19/2015 Discharge Date 02/24/2015 Primary MD Imelda Pillow, NP Admitting Physician Alford Highland, MD  Admission Diagnosis  Metatarsal fracture, right, closed, initial encounter [S92.301A]  Discharge Diagnosis   Active Problems:   Tibia fracture  nausea and vomiting Fall Accelerated hypertension Hypertriglyceridemia anxiety Neuropathy Psoriasis Chronic kidney disease stage IV Leukocytosis reactive      Hospital Course *Jesse Hirst is a 80 y.o. female with a known history of hypertension, stage III chronic kidney disease, hypertriglyceridemia. Patient presented after fall out stairs on the stone steps down 3 steps. She was noted to have nondisplaced oblique fracture of the proximal tibia and possibly fibula and also fracture of the fifth metatarsal. Orthopedics was called from the ER and advised leg immobilizer and follow-up in the office within a week. The patient was unable to put any weight down on the foot and leg and hospitalist services were contacted for further evaluation. Patient was very weak and continued to complain of nausea therefore she was admitted to the hospital. Patient was kept in the hospital orthopedic follow-up was done. Patient's feeling little better and is sneezing of rehabilitation. Currently rehabilitation is arranged.             Consults  orthopedic surgery  Significant Tests:  See full reports for all details    Dg Tibia/fibula Right  02/19/2015   CLINICAL DATA:  Acute right lower leg pain following fall today. Initial encounter.  EXAM: RIGHT TIBIA AND FIBULA - 2 VIEW  COMPARISON:  None.  FINDINGS: A nondisplaced oblique fracture of the proximal tibia is seen only on the lateral view is noted.  A possible nondisplaced fracture of the fibular head is present.  There is no evidence of subluxation or dislocation.  IMPRESSION: Nondisplaced oblique fracture of the  proximal tibia and possible nondisplaced fracture of the fibular head.   Electronically Signed   By: Harmon Pier M.D.   On: 02/19/2015 16:01   Ct Head Wo Contrast  02/19/2015   CLINICAL DATA:  The patient was walking down stone steps and tripped and fell around 0820 this AM. Pt has swelling and bruising to right knee and foot. Pt has abrasion noted to left knee and shin. Pt also has hematoma noted to right side of forehead.  EXAM: CT HEAD WITHOUT CONTRAST  TECHNIQUE: Contiguous axial images were obtained from the base of the skull through the vertex without intravenous contrast.  COMPARISON:  None.  FINDINGS: There is central and cortical atrophy. Periventricular white matter changes are consistent with small vessel disease. There is no intra or extra-axial fluid collection or mass lesion. The basilar cisterns and ventricles have a normal appearance. There is no CT evidence for acute infarction or hemorrhage. Bone windows show right frontal scalp edema not associated with underlying calvarial fracture. Images are degraded by patient motion artifact.  IMPRESSION: 1.  No evidence for acute intracranial abnormality. 2. Atrophy and small vessel disease. 3. Right frontal scalp edema.   Electronically Signed   By: Norva Pavlov M.D.   On: 02/19/2015 17:56   Dg Foot Complete Right  02/19/2015   CLINICAL DATA:  Lateral foot pain after falling today. Initial encounter.  EXAM: RIGHT FOOT COMPLETE - 3+ VIEW  COMPARISON:  None.  FINDINGS: The bones are demineralized. There is a nondisplaced fracture of the fifth metatarsal base which probably demonstrates intra-articular extension. The Lisfranc alignment is normal. There are moderate degenerative changes at the first metatarsal phalangeal  joint with a hallux valgus formation.  IMPRESSION: Nondisplaced intra-articular fracture of the fifth metatarsal base.   Electronically Signed   By: Carey Bullocks M.D.   On: 02/19/2015 15:58       Today   Subjective:    Mikaelah Trostle nausea is improved denies any chest pain or shortness of breath  Objective:   Blood pressure 153/70, pulse 65, temperature 98.3 F (36.8 C), temperature source Oral, resp. rate 18, height  (1.651 m), weight 71.305 kg (157 lb 3.2 oz), SpO2 99 %.  .  Intake/Output Summary (Last 24 hours) at 02/24/15 0852 Last data filed at 02/24/15 0801  Gross per 24 hour  Intake    480 ml  Output    900 ml  Net   -420 ml    Exam VITAL SIGNS: Blood pressure 153/70, pulse 65, temperature 98.3 F (36.8 C), temperature source Oral, resp. rate 18, height  (1.651 m), weight 71.305 kg (157 lb 3.2 oz), SpO2 99 %.  GENERAL:  81 y.o.-year-old patient lying in the bed with no acute distress.  EYES: Pupils equal, round, reactive to light and accommodation. No scleral icterus. Extraocular muscles intact.  HEENT: Head atraumatic, normocephalic. Oropharynx and nasopharynx clear.  NECK:  Supple, no jugular venous distention. No thyroid enlargement, no tenderness.  LUNGS: Normal breath sounds bilaterally, no wheezing, rales,rhonchi or crepitation. No use of accessory muscles of respiration.  CARDIOVASCULAR: S1, S2 normal. No murmurs, rubs, or gallops.  ABDOMEN: Soft, nontender, nondistended. Bowel sounds present. No organomegaly or mass.  EXTREMITIES: No pedal edema, cyanosis, or clubbing.  NEUROLOGIC: Cranial nerves II through XII are intact. Muscle strength 5/5 in all extremities. Sensation intact. Gait not checked.  PSYCHIATRIC: The patient is alert and oriented x 3.  SKIN: No obvious rash, lesion, or ulcer.   Data Review     CBC w Diff: Lab Results  Component Value Date   WBC 7.3 02/24/2015   HGB 9.3* 02/24/2015   HCT 28.5* 02/24/2015   PLT 256 02/24/2015   LYMPHOPCT 13% 02/19/2015   MONOPCT 8% 02/19/2015   EOSPCT 1% 02/19/2015   BASOPCT 1% 02/19/2015   CMP: Lab Results  Component Value Date   NA 142 02/20/2015   K 4.0 02/20/2015   CL 109 02/20/2015   CO2 25  02/20/2015   BUN 45* 02/20/2015   CREATININE 2.34* 02/20/2015  .  Micro Results No results found for this or any previous visit (from the past 240 hour(s)).      Code Status Orders        Start     Ordered   02/19/15 1906  Do not attempt resuscitation (DNR)   Continuous    Question Answer Comment  In the event of cardiac or respiratory ARREST Do not call a "code blue"   In the event of cardiac or respiratory ARREST Do not perform Intubation, CPR, defibrillation or ACLS   In the event of cardiac or respiratory ARREST Use medication by any route, position, wound care, and other measures to relive pain and suffering. May use oxygen, suction and manual treatment of airway obstruction as needed for comfort.   Comments nurse may pronounce      02/19/15 1905    Advance Directive Documentation        Most Recent Value   Type of Advance Directive  Living will   Pre-existing out of facility DNR order (yellow form or pink MOST form)     "MOST" Form in Place?  Follow-up Information    Follow up with Juanell Fairly, MD In 10 days.   Specialty:  Orthopedic Surgery   Contact information:   [address] [place] [address] [phone]       Follow up with Imelda Pillow, NP In 7 days.   Specialty:  Nurse Practitioner   Contact information:   [address] [place] [address] [phone]       Discharge Medications     Medication List    TAKE these medications        acetaminophen 325 MG tablet  Commonly known as:  TYLENOL  Take 2 tablets (650 mg total) by mouth every 6 (six) hours as needed for mild pain (or Fever >/= 101).     ALPRAZolam 0.25 MG tablet  Commonly known as:  XANAX  Take 0.25 mg by mouth 2 (two) times daily as needed for anxiety.     ALPRAZolam 0.25 MG tablet  Commonly known as:  XANAX  Take 1 tablet (0.25 mg total) by mouth 3 (three) times daily as needed for anxiety.     alum & mag hydroxide-simeth 200-200-20 MG/5ML  suspension  Commonly known as:  MAALOX/MYLANTA  Take 30 mLs by mouth every 6 (six) hours as needed for indigestion or heartburn.     amLODipine 10 MG tablet  Commonly known as:  NORVASC  Take 0.5 tablets (5 mg total) by mouth daily.     aspirin EC 81 MG tablet  Take 81 mg by mouth daily.     aspirin 81 MG EC tablet  Take 1 tablet (81 mg total) by mouth daily.     cetirizine 10 MG tablet  Commonly known as:  ZYRTEC  Take 10 mg by mouth daily.     cloNIDine 0.2 MG tablet  Commonly known as:  CATAPRES  Take 0.5 tablets (0.1 mg total) by mouth 2 (two) times daily.     docusate sodium 100 MG capsule  Commonly known as:  COLACE  Take 2 capsules (200 mg total) by mouth 2 (two) times daily.     enoxaparin 40 MG/0.4ML injection  Commonly known as:  LOVENOX  Inject 0.4 mLs (40 mg total) into the skin daily.     fenofibrate 145 MG tablet  Commonly known as:  TRICOR  Take 145 mg by mouth daily.     lisinopril 40 MG tablet  Commonly known as:  PRINIVIL,ZESTRIL  Take 40 mg by mouth daily.     metoprolol succinate 100 MG 24 hr tablet  Commonly known as:  TOPROL-XL  Take 100 mg by mouth daily. Take with or immediately following a meal.     ondansetron 4 MG tablet  Commonly known as:  ZOFRAN  Take 1 tablet (4 mg total) by mouth every 8 (eight) hours as needed for nausea or vomiting.     oxyCODONE 5 MG immediate release tablet  Commonly known as:  Oxy IR/ROXICODONE  Take 1 tablet (5 mg total) by mouth every 4 (four) hours as needed for moderate pain or severe pain.     senna 8.6 MG Tabs tablet  Commonly known as:  SENOKOT  Take 1 tablet (8.6 mg total) by mouth daily.     traMADol 50 MG tablet  Commonly known as:  ULTRAM  Take 1 tablet (50 mg total) by mouth every 6 (six) hours as needed for moderate pain.           Total Time in preparing paper work, data evaluation and  todays exam - 35 minutes  Auburn Bilberry M.D on 02/24/2015 at 8:52 AM  [facility]    Office  [phone]

## 2015-02-24 NOTE — Progress Notes (Signed)
Patient is medically stable for D/C to [facility] today. Per Kim admissions coordinator at [facility] patient is going to room 203-B. RN will call report at [phone] and arrange EMS for transport. Clinical Child psychotherapist (CSW) prepared D/C packet and sent D/C Summary and follow up appointments to [facility] via [facility]. Patient is aware of above. CSW contacted patient's daughter Dondra Spry and made her aware of above. RN has agreed to call Dondra Spry when EMS arrives. Please reconsult if future social work needs arise. CSW signing off.   Jetta Lout, LCSWA [phone]

## 2015-02-24 NOTE — Discharge Instructions (Signed)
°  DIET:  Cardiac diet  DISCHARGE CONDITION:  Stable  ACTIVITY:  Non weight bearing on right extermity, oob to chair  OXYGEN:  Home Oxygen: No.   Oxygen Delivery: room air  DISCHARGE LOCATION:  home    ADDITIONAL DISCHARGE INSTRUCTION:   If you experience worsening of your admission symptoms, develop shortness of breath, life threatening emergency, suicidal or homicidal thoughts you must seek medical attention immediately by calling 911 or calling your MD immediately  if symptoms less severe.  You Must read complete instructions/literature along with all the possible adverse reactions/side effects for all the Medicines you take and that have been prescribed to you. Take any new Medicines after you have completely understood and accpet all the possible adverse reactions/side effects.   Please note  You were cared for by a hospitalist during your hospital stay. If you have any questions about your discharge medications or the care you received while you were in the hospital after you are discharged, you can call the unit and asked to speak with the hospitalist on call if the hospitalist that took care of you is not available. Once you are discharged, your primary care physician will handle any further medical issues. Please note that NO REFILLS for any discharge medications will be authorized once you are discharged, as it is imperative that you return to your primary care physician (or establish a relationship with a primary care physician if you do not have one) for your aftercare needs so that they can reassess your need for medications and monitor your lab values.

## 2015-02-24 NOTE — Discharge Planning (Signed)
Pt will be discharged to [facility] today, Dondra Spry daughter was notified of release to facility. No bm noted, colace given. Iv removed and catheter intact. Vss. No pain at discharge.

## 2015-02-28 ENCOUNTER — Encounter
Admission: RE | Admit: 2015-02-28 | Discharge: 2015-02-28 | Disposition: A | Discharge status: Home / Self Care | Payer: Medicare Other | Source: Ambulatory Visit | Attending: Internal Medicine | Admitting: Internal Medicine

## 2015-03-31 ENCOUNTER — Encounter
Admission: RE | Admit: 2015-03-31 | Discharge: 2015-03-31 | Disposition: A | Discharge status: Home / Self Care | Payer: Medicare Other | Source: Ambulatory Visit | Attending: Internal Medicine | Admitting: Internal Medicine

## 2016-01-08 ENCOUNTER — Other Ambulatory Visit: Payer: Self-pay | Admitting: Nephrology

## 2016-01-08 ENCOUNTER — Ambulatory Visit
Admission: RE | Admit: 2016-01-08 | Discharge: 2016-01-08 | Disposition: A | Discharge status: Home / Self Care | Payer: Medicare Other | Source: Ambulatory Visit | Attending: Nephrology | Admitting: Nephrology

## 2016-01-08 DIAGNOSIS — R0602 Shortness of breath: Secondary | ICD-10-CM

## 2016-01-08 DIAGNOSIS — R0989 Other specified symptoms and signs involving the circulatory and respiratory systems: Secondary | ICD-10-CM

## 2016-01-08 DIAGNOSIS — J449 Chronic obstructive pulmonary disease, unspecified: Secondary | ICD-10-CM | POA: Diagnosis not present

## 2016-01-08 DIAGNOSIS — I517 Cardiomegaly: Secondary | ICD-10-CM | POA: Insufficient documentation

## 2016-01-08 DIAGNOSIS — I700 Atherosclerosis of aorta: Secondary | ICD-10-CM | POA: Diagnosis not present

## 2016-05-08 ENCOUNTER — Emergency Department
Admission: EM | Admit: 2016-05-08 | Discharge: 2016-05-09 | Disposition: A | Discharge status: Home / Self Care | Payer: Medicare Other | Source: Home / Self Care | Attending: Emergency Medicine | Admitting: Emergency Medicine

## 2016-05-08 ENCOUNTER — Emergency Department: Payer: Medicare Other

## 2016-05-08 DIAGNOSIS — S52022A Displaced fracture of olecranon process without intraarticular extension of left ulna, initial encounter for closed fracture: Secondary | ICD-10-CM | POA: Insufficient documentation

## 2016-05-08 DIAGNOSIS — Y929 Unspecified place or not applicable: Secondary | ICD-10-CM | POA: Insufficient documentation

## 2016-05-08 DIAGNOSIS — I10 Essential (primary) hypertension: Secondary | ICD-10-CM | POA: Insufficient documentation

## 2016-05-08 DIAGNOSIS — Z87891 Personal history of nicotine dependence: Secondary | ICD-10-CM | POA: Insufficient documentation

## 2016-05-08 DIAGNOSIS — R2681 Unsteadiness on feet: Secondary | ICD-10-CM

## 2016-05-08 DIAGNOSIS — Z7982 Long term (current) use of aspirin: Secondary | ICD-10-CM | POA: Insufficient documentation

## 2016-05-08 DIAGNOSIS — Y939 Activity, unspecified: Secondary | ICD-10-CM | POA: Insufficient documentation

## 2016-05-08 DIAGNOSIS — R296 Repeated falls: Secondary | ICD-10-CM | POA: Insufficient documentation

## 2016-05-08 DIAGNOSIS — W1839XA Other fall on same level, initial encounter: Secondary | ICD-10-CM

## 2016-05-08 DIAGNOSIS — Z79899 Other long term (current) drug therapy: Secondary | ICD-10-CM | POA: Insufficient documentation

## 2016-05-08 DIAGNOSIS — Y999 Unspecified external cause status: Secondary | ICD-10-CM

## 2016-05-08 DIAGNOSIS — R531 Weakness: Secondary | ICD-10-CM

## 2016-05-08 DIAGNOSIS — N179 Acute kidney failure, unspecified: Secondary | ICD-10-CM | POA: Diagnosis not present

## 2016-05-08 MED ORDER — OXYCODONE-ACETAMINOPHEN 5-325 MG PO TABS: 1.0000 | ORAL_TABLET | Freq: Once | ORAL | Status: DC

## 2016-05-08 MED ORDER — OXYCODONE-ACETAMINOPHEN 5-325 MG PO TABS
1.0000 | ORAL_TABLET | Freq: Once | ORAL | Status: AC
  Administered 2016-05-08: 1 via ORAL
  Filled 2016-05-08: qty 1

## 2016-05-08 NOTE — ED Notes (Signed)
Pt arrived via ems for c/o fall approx one hour ago - her only complaint is left elbow pain - she has contusion to left elbow but has full ROM without difficulty

## 2016-05-08 NOTE — ED Provider Notes (Addendum)
[facility] Emergency Department Provider Note        Time seen: ----------------------------------------- 8:27 PM on 05/08/2016 -----------------------------------------    I have reviewed the triage vital signs and the nursing notes.   HISTORY  Chief Complaint No chief complaint on file.    HPI Lacey Smith is a 82 y.o. female who presents to the ER for a mechanical fall. Patient states she fell at home landing on the left elbow. She is complaining of left elbow pain. Pain is worsened with movement of the left elbow. She denies fevers, chills or other complaints. She does have chronic weakness that she is happened last 2 years but she just wants to be checked out for the left elbow pain.   Past Medical History:  Diagnosis Date  . Anxiety   . Hypertension   . Hypertriglyceridemia   . Neuropathy   . Psoriasis   . Renal disorder     Patient Active Problem List   Diagnosis Date Noted  . Tibia fracture 02/19/2015    Past Surgical History:  Procedure Laterality Date  . Cataracts    . JOINT REPLACEMENT      Allergies Sulfa antibiotics  Social History Social History  Substance Use Topics  . Smoking status: Former Smoker    Years: 55.00    Quit date: 02/18/2005  . Smokeless tobacco: Not on file  . Alcohol use No    Review of Systems Constitutional: Negative for fever. Cardiovascular: Negative for chest pain. Respiratory: Negative for shortness of breath. Gastrointestinal: Negative for abdominal pain, vomiting and diarrhea. Musculoskeletal: Positive for left elbow pain Skin: Positive for left elbow contusion Neurological: Positive for chronic weakness  10-point ROS otherwise negative.  ____________________________________________   PHYSICAL EXAM:  VITAL SIGNS: ED Triage Vitals  Enc Vitals Group     BP      Pulse      Resp      Temp      Temp src      SpO2      Weight      Height      Head Circumference      Peak  Flow      Pain Score      Pain Loc      Pain Edu?      Excl. in GC?     Constitutional: Alert and oriented. Well appearing and in no distress. Eyes: Conjunctivae are normal. Normal extraocular movements. Musculoskeletal: Contusion over the left elbow, tenderness over the left elbow. Pain with range of motion of the elbow. Unremarkable left shoulder and left wrist Neurologic:  Normal speech and language. No gross focal neurologic deficits are appreciated.  Skin:  Contusion noted over the left elbow Psychiatric: Mood and affect are normal. Speech and behavior are normal.  ____________________________________________  ED COURSE:  Pertinent labs & imaging results that were available during my care of the patient were reviewed by me and considered in my medical decision making (see chart for details). Clinical Course   Patient presents to the ER in no distress after a fall with left elbow pain. She is requesting evaluation for her left arm only.  Procedures ____________________________________________   RADIOLOGY Images were viewed by me  Left elbow x-ray FINDINGS: There is a comminuted intra-articular fracture of the olecranon with proximal distraction of the main proximal fragment. There is a large elbow joint effusion. There is no dislocation. No definite fracture is identified of the distal humerus or proximal radius. Soft  tissue swelling is noted posteriorly at the elbow.  IMPRESSION: Comminuted olecranon fracture.  ____________________________________________  FINAL ASSESSMENT AND PLAN  Contusion, Olecranon fracture  Plan: Patient with imaging as dictated above. Patient is in no distress, did sustain a comminuted olecranon fracture. Ambulation will be difficult for her and that she uses a walker. We will discuss with the family a final disposition.  Family has reported they cannot care for her and she isn't prepared to go home. We will discuss with social work and  PT. She remains in a splint but appears medically stable otherwise.   Emily FilbertWilliams, Jonathan E, MD   Note: This dictation was prepared with Dragon dictation. Any transcriptional errors that result from this process are unintentional    Emily FilbertJonathan E Williams, MD 05/08/16 2130    Emily FilbertJonathan E Williams, MD 05/08/16 2216

## 2016-05-08 NOTE — ED Triage Notes (Signed)
Pt arrived via ems for c/o fall approx one hour ago - her only complaint is left elbow pain

## 2016-05-09 NOTE — Care Management Note (Signed)
Case Management Note  Patient Details  Name: Lacey DanceRebecca Bentivegna MRN: [id number] Date of Birth: Aug 09, 1928  Subjective/Objective:    Spoke to patient and family at bedside. [facility] and [facility]. They also will get a prescription for a quad cane per the PT eval. [facility] accepted the referral. MD is completeing the face to face and orders for PT, OT and CSW. No further questions at this time. RN for the patient also made  aware.                Action/Plan:   Expected Discharge Date:                  Expected Discharge Plan:     In-House Referral:     Discharge planning Services     Post Acute Care Choice:    Choice offered to:     DME Arranged:    DME Agency:     HH Arranged:    HH Agency:     Status of Service:     If discussed at [employer] Length of Stay Meetings, dates discussed:    Additional Comments:  Berna BueCheryl Ralph Brouwer, RN 05/09/2016, 9:48 AM

## 2016-05-09 NOTE — ED Notes (Signed)
CSW received consult for possible placement. Pt has Medicare and will not meet the qualifying stay requirements for SNF to be covered. PT is also recommending home health at this time. CSW consulted with RN CM and she is coordinating home health services for the pt. Pt and pt's family are agreeable to this. No further social work needs at this time. CSW signing off.  Jonathon JordanLynn B Cheryel Kyte, MSW, Theresia MajorsLCSWA [phone]

## 2016-05-09 NOTE — Care Management Note (Signed)
Case Management Note  Patient Details  Name: Lacey Smith MRN: [id number] Date of Birth: 21-Oct-1928  Subjective/Objective:     Family has also asked for a script for a lift chair, which they verified [facility] carries . Md has been given the request. The family has stated if MD not willing or if insurance does not pay, they will get one paying out of pocket.               Action/Plan:   Expected Discharge Date:                  Expected Discharge Plan:     In-House Referral:     Discharge planning Services     Post Acute Care Choice:    Choice offered to:     DME Arranged:    DME Agency:     HH Arranged:    HH Agency:     Status of Service:     If discussed at [employer] Length [employer] Stay Meetings, dates discussed:    Additional Comments:  Lacey BueCheryl Jozalynn Noyce, RN 05/09/2016, 10:35 AM

## 2016-05-09 NOTE — Evaluation (Signed)
Physical Therapy Evaluation Patient Details Name: Lacey DanceRebecca Smith MRN: [id number] DOB: 1928-06-07 Today's Date: 05/09/2016   History of Present Illness  Pt is a 82 y/o F sp fall with resultant comminuted L olecranon fx. Pt in sling.  Pt's PMH includes renal disorder, psoriasis, neuropathy, anxiety.      Clinical Impression  Pt admitted with above diagnosis. Pt currently with functional limitations due to the deficits listed below (see PT Problem List). Ms. Lacey Smith is anxious with mobility but is agreeable to PT.  He demonstrates mild unsteadiness using single point cane which improves with quad cane.  Pt is Ind PTA and refuses SNF at d/c.  Pt can have 24/7 assist from family at d/c, recommending HHPT.  Pt will benefit from skilled PT to increase their independence and safety with mobility to allow discharge to the venue listed below.      Follow Up Recommendations Home health PT;Supervision for mobility/OOB    Equipment Recommendations  Other (comment) (quad cane)    Recommendations for Other Services OT consult     Precautions / Restrictions Precautions Precautions: Fall Required Braces or Orthoses: Sling Restrictions Weight Bearing Restrictions: Yes LUE Weight Bearing: Non weight bearing      Mobility  Bed Mobility Overal bed mobility: Needs Assistance Bed Mobility: Supine to Sit;Sit to Supine     Supine to sit: Min guard Sit to supine: Min assist   General bed mobility comments: Pt requires cues for sequencing but no physical assist.  To return to supine pt requires minA to manage LEs into bed.  Transfers Overall transfer level: Needs assistance Equipment used: Straight cane;Quad cane Transfers: Sit to/from Stand Sit to Stand: Min guard         General transfer comment: Pt slow and anxious with mild instability but does not requires physical assist and no LOB.    Ambulation/Gait Ambulation/Gait assistance: Min guard Ambulation Distance (Feet): 60  Feet Assistive device: Quad cane;Straight cane Gait Pattern/deviations: Step-to pattern;Step-through pattern;Decreased stride length;Antalgic Gait velocity: decreased Gait velocity interpretation: Below normal speed for age/gender General Gait Details: Pt mildly unsteady with single point cane (20 ft) which improves with introduction of and gait training with quad cane (40 ft).  Pt politely declined ambulating in hallway x2.    Stairs Stairs:  (Instructed pt to have assist from family when entering home )          Wheelchair Mobility    Modified Rankin (Stroke Patients Only)       Balance Overall balance assessment: Needs assistance;History of Falls Sitting-balance support: Feet supported;No upper extremity supported Sitting balance-Leahy Scale: Fair     Standing balance support: No upper extremity supported;During functional activity Standing balance-Leahy Scale: Fair                               Pertinent Vitals/Pain Pain Assessment: 0-10 Pain Score: 4  Pain Location: L elbow and hand Pain Descriptors / Indicators: Heaviness Pain Intervention(s): Limited activity within patient's tolerance;Monitored during session    Home Living Family/patient expects to be discharged to:: Private residence Living Arrangements: Alone Available Help at Discharge: Family;Available PRN/intermittently Type of Home: House Home Access: Stairs to enter Entrance Stairs-Rails: Left Entrance Stairs-Number of Steps: 2 Home Layout: One level Home Equipment: Bedside commode;Other (comment);Walker - standard;Shower seat - built in;Hand held shower head (hiking pole) Additional Comments: Pt reports she will likely have someone come stay with her at her house at d/c rather  than staying at a family member's house.    Prior Function Level of Independence: Independent with assistive device(s)         Comments: Pt still driving.  Pt has a Advertising copywriterhousekeeper.  Ind with dressing, bathing.   Pt reports she had a "really bad fall crushing almost all the bones in my R leg" and says she is wobbly.  Pt reports 2 "little tumbles" over the past year.     Hand Dominance   Dominant Hand: Right    Extremity/Trunk Assessment   Upper Extremity Assessment: LUE deficits/detail       LUE Deficits / Details: in splint, NWB, olecranon fx   Lower Extremity Assessment: Overall WFL for tasks assessed      Cervical / Trunk Assessment: Kyphotic  Communication   Communication: HOH  Cognition Arousal/Alertness: Awake/alert Behavior During Therapy: Anxious Overall Cognitive Status: Within Functional Limits for tasks assessed                      General Comments General comments (skin integrity, edema, etc.): Pt's son arrived at end of session    Exercises     Assessment/Plan    PT Assessment Patient needs continued PT services  PT Problem List Decreased strength;Decreased range of motion;Decreased activity tolerance;Decreased balance;Decreased knowledge of use of DME;Decreased safety awareness;Pain          PT Treatment Interventions DME instruction;Gait training;Stair training;Functional mobility training;Therapeutic activities;Therapeutic exercise;Balance training;Patient/family education;Modalities    PT Goals (Current goals can be found in the Care Plan section)  Acute Rehab PT Goals Patient Stated Goal: to go home, pt refusing SNF PT Goal Formulation: With patient/family Time For Goal Achievement: 05/23/16 Potential to Achieve Goals: Good    Frequency 7X/week   Barriers to discharge        Co-evaluation               End of Session Equipment Utilized During Treatment: Gait belt Activity Tolerance: Patient tolerated treatment well Patient left: in bed;with call bell/phone within reach;with family/visitor present Nurse Communication: Mobility status    Functional Assessment Tool Used: Clinical Judgement Functional Limitation: Mobility: Walking  and moving around Mobility: Walking and Moving Around Current Status ([id number](G8978): At least 20 percent but less than 40 percent impaired, limited or restricted Mobility: Walking and Moving Around Goal Status [phone](G8979): At least 1 percent but less than 20 percent impaired, limited or restricted    Time: 0843-0926 PT Time Calculation (min) (ACUTE ONLY): 43 min   Charges:   PT Evaluation $PT Eval Low Complexity: 1 Procedure PT Treatments $Gait Training: 8-22 mins $Therapeutic Activity: 8-22 mins   PT G Codes:   PT G-Codes **NOT FOR INPATIENT CLASS** Functional Assessment Tool Used: Clinical Judgement Functional Limitation: Mobility: Walking and moving around Mobility: Walking and Moving Around Current Status (U9811(G8978): At least 20 percent but less than 40 percent impaired, limited or restricted Mobility: Walking and Moving Around Goal Status [phone](G8979): At least 1 percent but less than 20 percent impaired, limited or restricted   Encarnacion ChuAshley Braylyn Eye PT, DPT 05/09/2016, 9:55 AM

## 2016-05-09 NOTE — Care Management Note (Signed)
Case Management Note  Patient Details  Name: Lacey DanceRebecca Ciani MRN: [id number] Date of Birth: 06-18-1928  Subjective/Objective:     Saw that pt. Has been here all night. Family not present and pt. Is sleeping soundly. Will await family return, and PT input. Will work with CSW after eval.               Action/Plan:   Expected Discharge Date:                  Expected Discharge Plan:     In-House Referral:     Discharge planning Services     Post Acute Care Choice:    Choice offered to:     DME Arranged:    DME Agency:     HH Arranged:    HH Agency:     Status of Service:     If discussed at [employer] Meetings, dates discussed:    Additional Comments:  Berna BueCheryl Kourtnei Rauber, RN 05/09/2016, 8:11 AM

## 2016-05-10 ENCOUNTER — Inpatient Hospital Stay
Admission: EM | Admit: 2016-05-10 | Discharge: 2016-05-15 | DRG: 562 | Disposition: A | Discharge status: Hospice | Payer: Medicare Other | Attending: Internal Medicine | Admitting: Internal Medicine

## 2016-05-10 ENCOUNTER — Inpatient Hospital Stay: Payer: Medicare Other

## 2016-05-10 DIAGNOSIS — I349 Nonrheumatic mitral valve disorder, unspecified: Secondary | ICD-10-CM | POA: Diagnosis not present

## 2016-05-10 DIAGNOSIS — J9602 Acute respiratory failure with hypercapnia: Secondary | ICD-10-CM | POA: Diagnosis present

## 2016-05-10 DIAGNOSIS — G629 Polyneuropathy, unspecified: Secondary | ICD-10-CM | POA: Diagnosis present

## 2016-05-10 DIAGNOSIS — F419 Anxiety disorder, unspecified: Secondary | ICD-10-CM | POA: Diagnosis present

## 2016-05-10 DIAGNOSIS — F411 Generalized anxiety disorder: Secondary | ICD-10-CM | POA: Diagnosis not present

## 2016-05-10 DIAGNOSIS — Z7189 Other specified counseling: Secondary | ICD-10-CM

## 2016-05-10 DIAGNOSIS — J441 Chronic obstructive pulmonary disease with (acute) exacerbation: Secondary | ICD-10-CM | POA: Diagnosis present

## 2016-05-10 DIAGNOSIS — I10 Essential (primary) hypertension: Secondary | ICD-10-CM | POA: Diagnosis not present

## 2016-05-10 DIAGNOSIS — I132 Hypertensive heart and chronic kidney disease with heart failure and with stage 5 chronic kidney disease, or end stage renal disease: Secondary | ICD-10-CM | POA: Diagnosis present

## 2016-05-10 DIAGNOSIS — E781 Pure hyperglyceridemia: Secondary | ICD-10-CM | POA: Diagnosis present

## 2016-05-10 DIAGNOSIS — S52022A Displaced fracture of olecranon process without intraarticular extension of left ulna, initial encounter for closed fracture: Principal | ICD-10-CM | POA: Diagnosis present

## 2016-05-10 DIAGNOSIS — I5041 Acute combined systolic (congestive) and diastolic (congestive) heart failure: Secondary | ICD-10-CM | POA: Diagnosis present

## 2016-05-10 DIAGNOSIS — R0602 Shortness of breath: Secondary | ICD-10-CM | POA: Diagnosis not present

## 2016-05-10 DIAGNOSIS — M25522 Pain in left elbow: Secondary | ICD-10-CM

## 2016-05-10 DIAGNOSIS — Z8249 Family history of ischemic heart disease and other diseases of the circulatory system: Secondary | ICD-10-CM | POA: Diagnosis not present

## 2016-05-10 DIAGNOSIS — Z7982 Long term (current) use of aspirin: Secondary | ICD-10-CM | POA: Diagnosis not present

## 2016-05-10 DIAGNOSIS — Z882 Allergy status to sulfonamides status: Secondary | ICD-10-CM

## 2016-05-10 DIAGNOSIS — I959 Hypotension, unspecified: Secondary | ICD-10-CM | POA: Diagnosis present

## 2016-05-10 DIAGNOSIS — J9621 Acute and chronic respiratory failure with hypoxia: Secondary | ICD-10-CM | POA: Diagnosis not present

## 2016-05-10 DIAGNOSIS — N186 End stage renal disease: Secondary | ICD-10-CM | POA: Diagnosis present

## 2016-05-10 DIAGNOSIS — N189 Chronic kidney disease, unspecified: Secondary | ICD-10-CM | POA: Diagnosis not present

## 2016-05-10 DIAGNOSIS — D631 Anemia in chronic kidney disease: Secondary | ICD-10-CM | POA: Diagnosis present

## 2016-05-10 DIAGNOSIS — W010XXA Fall on same level from slipping, tripping and stumbling without subsequent striking against object, initial encounter: Secondary | ICD-10-CM | POA: Diagnosis present

## 2016-05-10 DIAGNOSIS — R0600 Dyspnea, unspecified: Secondary | ICD-10-CM

## 2016-05-10 DIAGNOSIS — E8770 Fluid overload, unspecified: Secondary | ICD-10-CM | POA: Diagnosis not present

## 2016-05-10 DIAGNOSIS — J9601 Acute respiratory failure with hypoxia: Secondary | ICD-10-CM | POA: Diagnosis present

## 2016-05-10 DIAGNOSIS — Z7901 Long term (current) use of anticoagulants: Secondary | ICD-10-CM | POA: Diagnosis not present

## 2016-05-10 DIAGNOSIS — Z66 Do not resuscitate: Secondary | ICD-10-CM | POA: Diagnosis present

## 2016-05-10 DIAGNOSIS — I4891 Unspecified atrial fibrillation: Secondary | ICD-10-CM | POA: Diagnosis present

## 2016-05-10 DIAGNOSIS — Z515 Encounter for palliative care: Secondary | ICD-10-CM | POA: Diagnosis present

## 2016-05-10 DIAGNOSIS — I482 Chronic atrial fibrillation: Secondary | ICD-10-CM | POA: Diagnosis not present

## 2016-05-10 DIAGNOSIS — N179 Acute kidney failure, unspecified: Secondary | ICD-10-CM | POA: Diagnosis present

## 2016-05-10 DIAGNOSIS — J9600 Acute respiratory failure, unspecified whether with hypoxia or hypercapnia: Secondary | ICD-10-CM

## 2016-05-10 DIAGNOSIS — R262 Difficulty in walking, not elsewhere classified: Secondary | ICD-10-CM

## 2016-05-10 DIAGNOSIS — E876 Hypokalemia: Secondary | ICD-10-CM | POA: Diagnosis present

## 2016-05-10 DIAGNOSIS — Z87891 Personal history of nicotine dependence: Secondary | ICD-10-CM

## 2016-05-10 DIAGNOSIS — R634 Abnormal weight loss: Secondary | ICD-10-CM | POA: Diagnosis present

## 2016-05-10 DIAGNOSIS — L409 Psoriasis, unspecified: Secondary | ICD-10-CM | POA: Diagnosis present

## 2016-05-10 DIAGNOSIS — J810 Acute pulmonary edema: Secondary | ICD-10-CM | POA: Diagnosis not present

## 2016-05-10 LAB — CBC WITH DIFFERENTIAL/PLATELET
BASOS ABS: 0.1 10*3/uL (ref 0–0.1)
BASOS PCT: 1 %
EOS ABS: 0.2 10*3/uL (ref 0–0.7)
Eosinophils Relative: 2 %
HEMATOCRIT: 27.7 % — AB (ref 35.0–47.0)
HEMOGLOBIN: 9.4 g/dL — AB (ref 12.0–16.0)
Lymphocytes Relative: 9 %
Lymphs Abs: 0.9 10*3/uL — ABNORMAL LOW (ref 1.0–3.6)
MCH: 31.1 pg (ref 26.0–34.0)
MCHC: 33.7 g/dL (ref 32.0–36.0)
MCV: 92.2 fL (ref 80.0–100.0)
Monocytes Absolute: 0.8 10*3/uL (ref 0.2–0.9)
Monocytes Relative: 8 %
NEUTROS ABS: 7.7 10*3/uL — AB (ref 1.4–6.5)
NEUTROS PCT: 80 %
Platelets: 327 10*3/uL (ref 150–440)
RBC: 3.01 MIL/uL — AB (ref 3.80–5.20)
RDW: 15.9 % — ABNORMAL HIGH (ref 11.5–14.5)
WBC: 9.6 10*3/uL (ref 3.6–11.0)

## 2016-05-10 LAB — COMPREHENSIVE METABOLIC PANEL
ALBUMIN: 2.5 g/dL — AB (ref 3.5–5.0)
ALK PHOS: 55 U/L (ref 38–126)
ALT: 27 U/L (ref 14–54)
ANION GAP: 11 (ref 5–15)
AST: 60 U/L — AB (ref 15–41)
BUN: 87 mg/dL — AB (ref 6–20)
CALCIUM: 9.1 mg/dL (ref 8.9–10.3)
CO2: 28 mmol/L (ref 22–32)
Chloride: 103 mmol/L (ref 101–111)
Creatinine, Ser: 3.36 mg/dL — ABNORMAL HIGH (ref 0.44–1.00)
GFR calc Af Amer: 13 mL/min — ABNORMAL LOW (ref >60)
GFR calc non Af Amer: 12 mL/min — ABNORMAL LOW (ref >60)
Glucose, Bld: 103 mg/dL — ABNORMAL HIGH (ref 65–99)
Potassium: 3.2 mmol/L — ABNORMAL LOW (ref 3.5–5.1)
SODIUM: 142 mmol/L (ref 135–145)
Total Bilirubin: 0.7 mg/dL (ref 0.3–1.2)
Total Protein: 6.2 g/dL — ABNORMAL LOW (ref 6.5–8.1)

## 2016-05-10 MED ORDER — SENNA 8.6 MG PO TABS
1.0000 | ORAL_TABLET | Freq: Every day | ORAL | Status: DC
  Administered 2016-05-11 – 2016-05-14 (×4): 8.6 mg via ORAL
  Filled 2016-05-10 (×4): qty 1

## 2016-05-10 MED ORDER — ONDANSETRON HCL 4 MG/2ML IJ SOLN
4.0000 mg | Freq: Once | INTRAMUSCULAR | Status: AC
Start: 2016-05-10 — End: 2016-05-10
  Administered 2016-05-10: 4 mg via INTRAVENOUS
  Filled 2016-05-10: qty 2

## 2016-05-10 MED ORDER — ASPIRIN EC 81 MG PO TBEC
81.0000 mg | DELAYED_RELEASE_TABLET | Freq: Every day | ORAL | Status: DC
  Administered 2016-05-11 – 2016-05-14 (×4): 81 mg via ORAL
  Filled 2016-05-10 (×4): qty 1

## 2016-05-10 MED ORDER — CLONIDINE HCL 0.1 MG PO TABS
0.1000 mg | ORAL_TABLET | Freq: Every day | ORAL | Status: DC
  Administered 2016-05-11: 0.1 mg via ORAL
  Filled 2016-05-10: qty 1

## 2016-05-10 MED ORDER — ACETAMINOPHEN 325 MG PO TABS: 650.0000 mg | ORAL_TABLET | Freq: Four times a day (QID) | ORAL | Status: DC | PRN

## 2016-05-10 MED ORDER — METOPROLOL SUCCINATE ER 100 MG PO TB24
100.0000 mg | ORAL_TABLET | Freq: Every day | ORAL | Status: DC
  Administered 2016-05-11 – 2016-05-12 (×2): 100 mg via ORAL
  Filled 2016-05-10 (×2): qty 1

## 2016-05-10 MED ORDER — ONDANSETRON HCL 4 MG PO TABS: 4.0000 mg | ORAL_TABLET | Freq: Four times a day (QID) | ORAL | Status: DC | PRN

## 2016-05-10 MED ORDER — ACETAMINOPHEN 650 MG RE SUPP
650.0000 mg | Freq: Four times a day (QID) | RECTAL | Status: DC | PRN
  Administered 2016-05-14: 650 mg via RECTAL
  Filled 2016-05-10: qty 1

## 2016-05-10 MED ORDER — ONDANSETRON HCL 4 MG/2ML IJ SOLN: 4.0000 mg | Freq: Four times a day (QID) | INTRAMUSCULAR | Status: DC | PRN

## 2016-05-10 MED ORDER — ONDANSETRON HCL 4 MG PO TABS: 4.0000 mg | ORAL_TABLET | Freq: Three times a day (TID) | ORAL | Status: DC | PRN

## 2016-05-10 MED ORDER — ACETAMINOPHEN 325 MG PO TABS
650.0000 mg | ORAL_TABLET | Freq: Four times a day (QID) | ORAL | Status: DC | PRN
  Administered 2016-05-13: 650 mg via ORAL
  Filled 2016-05-10: qty 2

## 2016-05-10 MED ORDER — OXYCODONE HCL 5 MG PO TABS
5.0000 mg | ORAL_TABLET | ORAL | Status: DC | PRN
  Administered 2016-05-11: 5 mg via ORAL
  Filled 2016-05-10: qty 1

## 2016-05-10 MED ORDER — ALUM & MAG HYDROXIDE-SIMETH 200-200-20 MG/5ML PO SUSP: 30.0000 mL | Freq: Four times a day (QID) | ORAL | Status: DC | PRN

## 2016-05-10 MED ORDER — HEPARIN SODIUM (PORCINE) 5000 UNIT/ML IJ SOLN
5000.0000 [IU] | Freq: Three times a day (TID) | INTRAMUSCULAR | Status: DC
  Administered 2016-05-10 – 2016-05-14 (×11): 5000 [IU] via SUBCUTANEOUS
  Filled 2016-05-10 (×12): qty 1

## 2016-05-10 MED ORDER — SENNOSIDES-DOCUSATE SODIUM 8.6-50 MG PO TABS
1.0000 | ORAL_TABLET | Freq: Every evening | ORAL | Status: DC | PRN
  Administered 2016-05-10 – 2016-05-13 (×2): 1 via ORAL
  Filled 2016-05-10 (×2): qty 1

## 2016-05-10 MED ORDER — ALPRAZOLAM 0.25 MG PO TABS
0.2500 mg | ORAL_TABLET | Freq: Two times a day (BID) | ORAL | Status: DC | PRN
  Administered 2016-05-11: 0.25 mg via ORAL
  Filled 2016-05-10: qty 1

## 2016-05-10 MED ORDER — SODIUM CHLORIDE 0.9 % IV SOLN
INTRAVENOUS | Status: DC
  Administered 2016-05-10 – 2016-05-11 (×2): via INTRAVENOUS

## 2016-05-10 MED ORDER — LORATADINE 10 MG PO TABS
10.0000 mg | ORAL_TABLET | Freq: Every day | ORAL | Status: DC
  Administered 2016-05-11: 10 mg via ORAL
  Filled 2016-05-10 (×2): qty 1

## 2016-05-10 MED ORDER — DOCUSATE SODIUM 100 MG PO CAPS
200.0000 mg | ORAL_CAPSULE | Freq: Two times a day (BID) | ORAL | Status: DC | PRN
  Administered 2016-05-12: 200 mg via ORAL
  Filled 2016-05-10 (×2): qty 2

## 2016-05-10 MED ORDER — AMLODIPINE BESYLATE 5 MG PO TABS
5.0000 mg | ORAL_TABLET | Freq: Every day | ORAL | Status: DC
  Administered 2016-05-11: 5 mg via ORAL
  Filled 2016-05-10: qty 1

## 2016-05-10 MED ORDER — HYDROMORPHONE HCL 1 MG/ML IJ SOLN
1.0000 mg | Freq: Once | INTRAMUSCULAR | Status: AC
  Administered 2016-05-10: 1 mg via INTRAVENOUS
  Filled 2016-05-10: qty 1

## 2016-05-10 NOTE — ED Provider Notes (Signed)
Time Seen: Approximately 1210  I have reviewed the triage notes  Chief Complaint: Dizziness   History of Present Illness: Lacey Smith is a 82 y.o. female *who presents after having a fall on Sunday. Patient has a right-sided comminuted olecranon fracture. Patient was discharged home and apparently was referred back to the emergency department for evaluation of some increasing renal insufficiency/renal failure. The patient states she's had some episodes of feeling dizzy and nauseated. She describes her dizziness was somewhat vertiginous type symptoms and apparently is set to start dialysis in the near future. Phone contact was made through the nephrology office and the patient was referred here to the emergency department and then likely hospitalist consultation. Patient apparently had some outpatient physical therapy, rehabilitation scheduled during her previous emergency department visit  Past Medical History:  Diagnosis Date  . Anxiety   . Hypertension   . Hypertriglyceridemia   . Neuropathy (HCC)   . Psoriasis   . Renal disorder     Patient Active Problem List   Diagnosis Date Noted  . Acute kidney injury (HCC) 05/10/2016  . Tibia fracture 02/19/2015    Past Surgical History:  Procedure Laterality Date  . Cataracts    . JOINT REPLACEMENT      Past Surgical History:  Procedure Laterality Date  . Cataracts    . JOINT REPLACEMENT      Current Outpatient Rx  . Order #: [id number] Class: No Print  . Order #: [id number] Class: Historical Med  . Order #: [id number] Class: Print  . Order #: [id number] Class: No Print  . Order #: [id number] Class: No Print  . Order #: [id number] Class: No Print  . Order #: [id number] Class: No Print  . Order #: [id number] Class: Normal  . Order #: [id number] Class: Historical Med  . Order #: [id number] Class: Historical Med  . Order #: [id number] Class: Historical Med  . Order #: [id number] Class: Historical Med  . Order #: [id number] Class: Normal  .  Order #: [id number] Class: No Print  . Order #: [id number] Class: Print  . Order #: [id number] Class: Print    Allergies:  Sulfa antibiotics  Family History: Family History  Problem Relation Age of Onset  . Heart failure Mother   . Heart attack Father   . Rheumatic fever Father     Social History: Social History  Substance Use Topics  . Smoking status: Former Smoker    Years: 55.00    Quit date: 02/18/2005  . Smokeless tobacco: Never Used  . Alcohol use No     Review of Systems:   10 point review of systems was performed and was otherwise negative:  Constitutional: No fever Eyes: No visual disturbances ENT: No sore throat, ear pain Cardiac: No chest pain Respiratory: No shortness of breath, wheezing, or stridor Abdomen: No abdominal pain, no vomiting, No diarrhea Endocrine: No weight loss, No night sweats Extremities: No peripheral edema, cyanosis Skin: No rashes, easy bruising Neurologic: No focal weakness, trouble with speech or swollowing Urologic: No dysuria, Hematuria, or urinary frequency   Physical Exam:  ED Triage Vitals  Enc Vitals Group     BP 05/10/16 1122 (!) 165/71     Pulse Rate 05/10/16 1122 66     Resp 05/10/16 1122 16     Temp 05/10/16 1122 97.6 F (36.4 C)     Temp Source 05/10/16 1122 Oral     SpO2 05/10/16 1122 98 %     Weight 05/10/16 1122 138 lb (62.6 kg)     Height 05/10/16 1122 5\' 4"  (1.626 m)  Head Circumference --      Peak Flow --      Pain Score 05/10/16 1123 8     Pain Loc --      Pain Edu? --      Excl. in GC? --     General: Awake , Alert , and Oriented times 3; GCS 15 Head: Normal cephalic , atraumatic Eyes: Pupils equal , round, reactive to light Nose/Throat: No nasal drainage, patent upper airway without erythema or exudate.  Neck: Supple, Full range of motion, No anterior adenopathy or palpable thyroid masses Lungs: Clear to ascultation without wheezes , rhonchi, or rales Heart: Regular rate, regular rhythm without  murmurs , gallops , or rubs Abdomen: Soft, non tender without rebound, guarding , or rigidity; bowel sounds positive and symmetric in all 4 quadrants. No organomegaly .        Extremities: Patient has some tenderness toward the right elbow and was left in a already present arm sling. Appears to be neurovascularly intact with good radial, home R, and median nerve function. She has good 2+ radial and ulnar pulses.  Neurologic: , Motor symmetric without deficits, sensory intact Skin: warm, dry, no rashes   Labs:   All laboratory work was reviewed including any pertinent negatives or positives listed below:  Labs Reviewed  CBC WITH DIFFERENTIAL/PLATELET - Abnormal; Notable for the following:       Result Value   RBC 3.01 (*)    Hemoglobin 9.4 (*)    HCT 27.7 (*)    RDW 15.9 (*)    Neutro Abs 7.7 (*)    Lymphs Abs 0.9 (*)    All other components within normal limits  COMPREHENSIVE METABOLIC PANEL - Abnormal; Notable for the following:    Potassium 3.2 (*)    Glucose, Bld 103 (*)    BUN 87 (*)    Creatinine, Ser 3.36 (*)    Total Protein 6.2 (*)    Albumin 2.5 (*)    AST 60 (*)    GFR calc non Af Amer 12 (*)    GFR calc Af Amer 13 (*)    All other components within normal limits   Patient's renal function appears to be steadily worsening and has an elevated BUN of 87. Radiology:  "Dg Chest 2 View  Result Date: 05/10/2016 CLINICAL DATA:  Shortness of breath today EXAM: CHEST  2 VIEW COMPARISON:  01/08/2016 FINDINGS: There are small bilateral pleural effusions. There is mild atelectasis at the left lung base. No focal consolidation. Moderate cardiomegaly. No pneumothorax. Patchy peripheral opacities on the right could reflect pulmonary infiltrates. IMPRESSION: 1. Small bilateral pleural effusion. Patchy peripheral right lung opacities, possibly related to infiltrates 2. Moderate cardiomegaly without overt failure Electronically Signed   By: Jasmine PangKim  Fujinaga M.D.   On: 05/10/2016 14:25    Dg Elbow Complete Left (3+view)  Result Date: 05/08/2016 CLINICAL DATA:  Fall.  Left elbow pain.  Initial encounter. EXAM: LEFT ELBOW - COMPLETE 3+ VIEW COMPARISON:  None. FINDINGS: There is a comminuted intra-articular fracture of the olecranon with proximal distraction of the main proximal fragment. There is a large elbow joint effusion. There is no dislocation. No definite fracture is identified of the distal humerus or proximal radius. Soft tissue swelling is noted posteriorly at the elbow. IMPRESSION: Comminuted olecranon fracture. Electronically Signed   By: Sebastian AcheAllen  Grady M.D.   On: 05/08/2016 21:25  " I personally reviewed the radiologic studies    ED Course:  Patient's stay here  was uneventful and the patient's case was reviewed with the hospitalist team for nephrology and orthopedic consultations. Clinical Course      Assessment: Acute renal failure Right-sided olecranon fracture   Final Clinical Impression:  Final diagnoses:  Acute renal failure, unspecified acute renal failure type [facility])     Plan: Inpatient management           Jennye Moccasin, MD 05/10/16 1621

## 2016-05-10 NOTE — ED Notes (Signed)
Attempted to call report and was told that the nurse would not be available for 5 minutes or so - requested to give report to charge nurse and was told charge nurse was not at desk - awaiting return call

## 2016-05-10 NOTE — Care Management Note (Signed)
Case Management Note  Patient Details  Name: Lacey Smith MRN: [id number] Date of Birth: 1928/07/16  Subjective/Objective:   Spoke to patient and son at bedside. They were under the impression the nephrologist was sending them through the ER for direct admission to have ortho see her about her elbow, and pending dialysis . I have explained to them both that there are no orders in place at this time, and that again we have no criteria to admit her to hospital.  The MD in the ER has paged Dr Wynelle Linkkolluru and is awaiting a callback. The patient and family are frustrated but very understanding. MD made aware.               Action/Plan:   Expected Discharge Date:                  Expected Discharge Plan:     In-House Referral:     Discharge planning Services     Post Acute Care Choice:    Choice offered to:     DME Arranged:    DME Agency:     HH Arranged:    HH Agency:     Status of Service:     If discussed at [employer] Length of Stay Meetings, dates discussed:    Additional Comments:  Lacey BueCheryl Taylormarie Register, RN 05/10/2016, 12:59 PM

## 2016-05-10 NOTE — ED Triage Notes (Signed)
Pt arrived via ems for c/o pain, nausea, vertigo - pt fell Sunday and was brought to er via ems - pt has fractured left elbow and is having increased pain in the elbow - she is set to start dialysis soon and talked to her nephrologist who sent her back to the er today for evaluation  

## 2016-05-10 NOTE — H&P (Signed)
[facility] [place] [facility]   PATIENT NAME: Lacey Smith    MR#:  [id number]  DATE OF BIRTH:  [birth year]  DATE OF ADMISSION:  05/10/2016  PRIMARY CARE PHYSICIAN: Imelda Pillow, NP   REQUESTING/REFERRING PHYSICIAN: dr Langston Masker  CHIEF COMPLAINT:   Sent from PCP for increasing creatinine HISTORY OF PRESENT ILLNESS:  Lacey Smith  is a 82 y.o. female with a known history of CKD stage 4Was sent from her nephrologist due to acute kidney injury and follow-up for her elbow fracture. Patient was seen on December 10 after mechanical fall and suffered a left elbow fracture. She was advised to follow-up with orthopedic surgery. Today she contacted her nephrologist who recommended to come to the ER for further evaluation. In the emergency room her creatinine is 3.3 which has increased from the last creatinine we have from September 2016 which is 2.3.  PAST MEDICAL HISTORY:   Past Medical History:  Diagnosis Date  . Anxiety   . Hypertension   . Hypertriglyceridemia   . Neuropathy (HCC)   . Psoriasis   . Renal disorder     PAST SURGICAL HISTORY:   Past Surgical History:  Procedure Laterality Date  . Cataracts    . JOINT REPLACEMENT      SOCIAL HISTORY:   Social History  Substance Use Topics  . Smoking status: Former Smoker    Years: 55.00    Quit date: 02/18/2005  . Smokeless tobacco: Never Used  . Alcohol use No    FAMILY HISTORY:   Family History  Problem Relation Age of Onset  . Heart failure Mother   . Heart attack Father   . Rheumatic fever Father     DRUG ALLERGIES:   Allergies  Allergen Reactions  . Sulfa Antibiotics     REVIEW OF SYSTEMS:   Review of Systems  Constitutional: Negative.  Negative for chills, fever and malaise/fatigue.  HENT: Negative.  Negative for ear discharge, ear pain, hearing loss, nosebleeds and sore throat.   Eyes: Negative.  Negative for blurred vision and pain.  Respiratory: Negative.  Negative  for cough, hemoptysis, shortness of breath and wheezing.   Cardiovascular: Negative.  Negative for chest pain, palpitations and leg swelling.  Gastrointestinal: Negative.  Negative for abdominal pain, blood in stool, diarrhea, nausea and vomiting.  Genitourinary: Negative.  Negative for dysuria.  Musculoskeletal: Positive for falls and joint pain. Negative for back pain.  Skin: Negative.   Neurological: Negative for dizziness, tremors, speech change, focal weakness, seizures and headaches.  Endo/Heme/Allergies: Negative.  Does not bruise/bleed easily.  Psychiatric/Behavioral: Negative.  Negative for depression, hallucinations and suicidal ideas.    MEDICATIONS AT HOME:   Prior to Admission medications   Medication Sig Start Date End Date Taking? Authorizing Provider  acetaminophen (TYLENOL) 325 MG tablet Take 2 tablets (650 mg total) by mouth every 6 (six) hours as needed for mild pain (or Fever >/= 101). 02/24/15   Auburn Bilberry, MD  ALPRAZolam Prudy Feeler) 0.25 MG tablet Take 0.25 mg by mouth 2 (two) times daily as needed for anxiety.    Historical Provider, MD  ALPRAZolam Prudy Feeler) 0.25 MG tablet Take 1 tablet (0.25 mg total) by mouth 3 (three) times daily as needed for anxiety. 02/24/15   Auburn Bilberry, MD  alum & mag hydroxide-simeth (MAALOX/MYLANTA) 200-200-20 MG/5ML suspension Take 30 mLs by mouth every 6 (six) hours as needed for indigestion or heartburn. 02/24/15   Auburn Bilberry, MD  amLODipine (NORVASC) 10 MG tablet Take 0.5 tablets (  5 mg total) by mouth daily. 02/24/15   Auburn BilberryShreyang Patel, MD  aspirin EC 81 MG EC tablet Take 1 tablet (81 mg total) by mouth daily. 02/24/15   Auburn BilberryShreyang Patel, MD  aspirin EC 81 MG tablet Take 81 mg by mouth daily.    Historical Provider, MD  cetirizine (ZYRTEC) 10 MG tablet Take 10 mg by mouth daily.    Historical Provider, MD  cloNIDine (CATAPRES) 0.2 MG tablet Take 0.5 tablets (0.1 mg total) by mouth 2 (two) times daily. 02/24/15   Auburn BilberryShreyang Patel, MD  docusate  sodium (COLACE) 100 MG capsule Take 2 capsules (200 mg total) by mouth 2 (two) times daily. 02/24/15   Auburn BilberryShreyang Patel, MD  enoxaparin (LOVENOX) 40 MG/0.4ML injection Inject 0.4 mLs (40 mg total) into the skin daily. 02/24/15 03/25/16  Auburn BilberryShreyang Patel, MD  fenofibrate (TRICOR) 145 MG tablet Take 145 mg by mouth daily.    Historical Provider, MD  lisinopril (PRINIVIL,ZESTRIL) 40 MG tablet Take 40 mg by mouth daily.    Historical Provider, MD  metoprolol succinate (TOPROL-XL) 100 MG 24 hr tablet Take 100 mg by mouth daily. Take with or immediately following a meal.    Historical Provider, MD  ondansetron (ZOFRAN) 4 MG tablet Take 1 tablet (4 mg total) by mouth every 8 (eight) hours as needed for nausea or vomiting. 02/24/15   Auburn BilberryShreyang Patel, MD  oxyCODONE (OXY IR/ROXICODONE) 5 MG immediate release tablet Take 1 tablet (5 mg total) by mouth every 4 (four) hours as needed for moderate pain or severe pain. 02/24/15   Auburn BilberryShreyang Patel, MD  senna (SENOKOT) 8.6 MG TABS tablet Take 1 tablet (8.6 mg total) by mouth daily. 02/24/15   Auburn BilberryShreyang Patel, MD  traMADol (ULTRAM) 50 MG tablet Take 1 tablet (50 mg total) by mouth every 6 (six) hours as needed for moderate pain. 02/24/15   Auburn BilberryShreyang Patel, MD      VITAL SIGNS:  Blood pressure (!) 165/71, pulse 66, temperature 97.6 F (36.4 C), temperature source Oral, resp. rate 16, height 5\' 4"  (1.626 m), weight 62.6 kg (138 lb), SpO2 98 %.  PHYSICAL EXAMINATION:   Physical Exam  Constitutional: She is oriented to person, place, and time and well-developed, well-nourished, and in no distress. No distress.  HENT:  Head: Normocephalic.  Eyes: No scleral icterus.  Neck: Normal range of motion. Neck supple. No JVD present. No tracheal deviation present.  Cardiovascular: Normal rate, regular rhythm and normal heart sounds.  Exam reveals no gallop and no friction rub.   No murmur heard. Pulmonary/Chest: Effort normal and breath sounds normal. No respiratory distress. She has no  wheezes. She has no rales. She exhibits no tenderness.  Abdominal: Soft. Bowel sounds are normal. She exhibits no distension and no mass. There is no tenderness. There is no rebound and no guarding.  Musculoskeletal: Normal range of motion. She exhibits no edema.  Neurological: She is alert and oriented to person, place, and time.  Skin: Skin is warm. No rash noted. No erythema.  Psychiatric: Affect and judgment normal.      LABORATORY PANEL:   CBC  Recent Labs Lab 05/10/16 1240  WBC 9.6  HGB 9.4*  HCT 27.7*  PLT 327   ------------------------------------------------------------------------------------------------------------------  Chemistries   Recent Labs Lab 05/10/16 1240  NA 142  K 3.2*  CL 103  CO2 28  GLUCOSE 103*  BUN 87*  CREATININE 3.36*  CALCIUM 9.1  AST 60*  ALT 27  ALKPHOS 55  BILITOT 0.7   ------------------------------------------------------------------------------------------------------------------  Cardiac Enzymes No results for input(s): TROPONINI in the last 168 hours. ------------------------------------------------------------------------------------------------------------------  RADIOLOGY:  Dg Elbow Complete Left (3+view)  Result Date: 05/08/2016 CLINICAL DATA:  Fall.  Left elbow pain.  Initial encounter. EXAM: LEFT ELBOW - COMPLETE 3+ VIEW COMPARISON:  None. FINDINGS: There is a comminuted intra-articular fracture of the olecranon with proximal distraction of the main proximal fragment. There is a large elbow joint effusion. There is no dislocation. No definite fracture is identified of the distal humerus or proximal radius. Soft tissue swelling is noted posteriorly at the elbow. IMPRESSION: Comminuted olecranon fracture. Electronically Signed   By: Sebastian AcheAllen  Grady M.D.   On: 05/08/2016 21:25    EKG:  No orders found for this or any previous visit.  IMPRESSION AND PLAN:   82 year old female with chronic kidney disease stage IV status  post mechanical fall on December 10 with a left elbow fracture who now has worsening creatinine.  1. Acute on chronic kidney injury stage IV: Consult nephrology. Avoid nephrotoxic agents. Mild IV hydration   2. Left elbow fracture after mechanical fall: Consult orthopedic surgery  3. Hypokalemia: Replete  4. Essential hypertension: Continue Norvasc and clonidine and metoprolol Due to worsening creatinine will discontinue lisinopril  5 anxiety: On when necessary Xanax   All the records are reviewed and case discussed with ED provider. Management plans discussed with the patient and she is in agreement  CODE STATUS: DNR  TOTAL TIME TAKING CARE OF THIS PATIENT: 45 minutes.    Missey Hasley M.D on 05/10/2016 at 2:14 PM  Between 7am to 6pm - Pager - [phone]  After 6pm go to [email or web address] - Social research officer, governmentpassword [email or web address] [email or web address]  [facility]  Office  [phone]  CC: Primary care physician; Imelda PillowHOLLAND, CHELSA, NP

## 2016-05-10 NOTE — ED Notes (Signed)
Called [facility] per Dr Huel CoteQuigley to find out why pt was sent to the ed today - Dr Wynelle LinkKolluru has been paged

## 2016-05-10 NOTE — Progress Notes (Signed)
Family Meeting Note  Advance Directive:no  Today a meeting took place with the Patient.    The following clinical team members were present during this meeting:MD  The following were discussed:Patient's diagnosis: AKI on chronic kidney disease stage IV , Patient's progosis: Unable to determine and Goals for treatment: DNR  Additional follow-up to be provided: needs advanced directives and this can be done while she is in the hospital  Time spent during discussion:16 minutes  Julizza Sassone, MD

## 2016-05-10 NOTE — ED Notes (Signed)
Pt arrived via ems for c/o pain, nausea, vertigo - pt fell Sunday and was brought to er via ems - pt has fractured left elbow and is having increased pain in the elbow - she is set to start dialysis soon and talked to her nephrologist who sent her back to the er today for evaluation

## 2016-05-10 NOTE — Consult Note (Signed)
ORTHOPAEDIC CONSULTATION  REQUESTING PHYSICIAN: Adrian Saran, MD  Chief Complaint:   Left elbow injury.  History of Present Illness: Lacey Smith is a 82 y.o. female with multiple medical problems including stage IV renal disease, hypertension, hypertriglyceridemia psoriasis and anxiety. The patient lives at home independently. Apparently, she lost her balance and fell several days ago, landing on her left elbow. She presented to emergency room where x-rays of her left elbow demonstrated a comminuted intra-articular fracture of the olecranon. After discussion with the ER physician, she felt that she was going to try to treat this nonsurgically, so the arm was placed in the posterior splint and she planned on following up with her orthopedic surgeon, Dr. Martha Clan. The patient returned to the emergency room this morning complaining of dizziness. Additional lab work demonstrated worsening renal function with elevation and her BUN/creatinine levels, so she was admitted for further workup and initiation of dialysis. Because of this admission, an orthopedic consultation was requested. The patient denies any numbness or paresthesias to her left hand, and denies any associated injury resulting from the fall.  Past Medical History:  Diagnosis Date  . Anxiety   . Hypertension   . Hypertriglyceridemia   . Neuropathy (HCC)   . Psoriasis   . Renal disorder    Past Surgical History:  Procedure Laterality Date  . Cataracts    . JOINT REPLACEMENT     Social History   Social History  . Marital status: Widowed    Spouse name: N/A  . Number of children: N/A  . Years of education: N/A   Social History Main Topics  . Smoking status: Former Smoker    Years: 55.00    Quit date: 02/18/2005  . Smokeless tobacco: Never Used  . Alcohol use No  . Drug use: No  . Sexual activity: Not Asked   Other Topics Concern  . None   Social History  Narrative  . None   Family History  Problem Relation Age of Onset  . Heart failure Mother   . Heart attack Father   . Rheumatic fever Father    Allergies  Allergen Reactions  . Sulfa Antibiotics    Prior to Admission medications   Medication Sig Start Date End Date Taking? Authorizing Provider  acetaminophen (TYLENOL) 325 MG tablet Take 2 tablets (650 mg total) by mouth every 6 (six) hours as needed for mild pain (or Fever >/= 101). 02/24/15  Yes Auburn Bilberry, MD  ALPRAZolam Prudy Feeler) 0.25 MG tablet Take 0.25 mg by mouth 2 (two) times daily as needed for anxiety.   Yes Historical Provider, MD  ALPRAZolam (XANAX) 0.25 MG tablet Take 1 tablet (0.25 mg total) by mouth 3 (three) times daily as needed for anxiety. 02/24/15  Yes Auburn Bilberry, MD  alum & mag hydroxide-simeth (MAALOX/MYLANTA) 200-200-20 MG/5ML suspension Take 30 mLs by mouth every 6 (six) hours as needed for indigestion or heartburn. 02/24/15  Yes Auburn Bilberry, MD  amLODipine (NORVASC) 10 MG tablet Take 0.5 tablets (5 mg total) by mouth daily. Patient taking differently: Take 10 mg by mouth daily.  02/24/15  Yes Auburn Bilberry, MD  aspirin EC 81 MG EC tablet Take 1 tablet (81 mg total) by mouth daily. 02/24/15  Yes Auburn Bilberry, MD  cloNIDine (CATAPRES) 0.2 MG tablet Take 0.5 tablets (0.1 mg total) by mouth 2 (two) times daily. Patient taking differently: Take 0.1 mg by mouth daily.  02/24/15  Yes Auburn Bilberry, MD  docusate sodium (COLACE) 100 MG capsule Take 2  capsules (200 mg total) by mouth 2 (two) times daily. 02/24/15  Yes Auburn BilberryShreyang Patel, MD  fenofibrate (TRICOR) 145 MG tablet Take 145 mg by mouth daily.   Yes Historical Provider, MD  furosemide (LASIX) 20 MG tablet Take 40 mg by mouth daily.   Yes Historical Provider, MD  lisinopril (PRINIVIL,ZESTRIL) 40 MG tablet Take 40 mg by mouth daily.   Yes Historical Provider, MD  metoprolol succinate (TOPROL-XL) 100 MG 24 hr tablet Take 100 mg by mouth daily. Take with or  immediately following a meal.   Yes Historical Provider, MD  ondansetron (ZOFRAN) 4 MG tablet Take 1 tablet (4 mg total) by mouth every 8 (eight) hours as needed for nausea or vomiting. 02/24/15  Yes Auburn BilberryShreyang Patel, MD  senna (SENOKOT) 8.6 MG TABS tablet Take 1 tablet (8.6 mg total) by mouth daily. 02/24/15  Yes Auburn BilberryShreyang Patel, MD  traMADol (ULTRAM) 50 MG tablet Take 1 tablet (50 mg total) by mouth every 6 (six) hours as needed for moderate pain. 02/24/15  Yes Auburn BilberryShreyang Patel, MD  oxyCODONE (OXY IR/ROXICODONE) 5 MG immediate release tablet Take 1 tablet (5 mg total) by mouth every 4 (four) hours as needed for moderate pain or severe pain. Patient not taking: Reported on 05/10/2016 02/24/15   Auburn BilberryShreyang Patel, MD   Dg Chest 2 View  Result Date: 05/10/2016 CLINICAL DATA:  Shortness of breath today EXAM: CHEST  2 VIEW COMPARISON:  01/08/2016 FINDINGS: There are small bilateral pleural effusions. There is mild atelectasis at the left lung base. No focal consolidation. Moderate cardiomegaly. No pneumothorax. Patchy peripheral opacities on the right could reflect pulmonary infiltrates. IMPRESSION: 1. Small bilateral pleural effusion. Patchy peripheral right lung opacities, possibly related to infiltrates 2. Moderate cardiomegaly without overt failure Electronically Signed   By: Jasmine PangKim  Fujinaga M.D.   On: 05/10/2016 14:25   Dg Elbow Complete Left (3+view)  Result Date: 05/08/2016 CLINICAL DATA:  Fall.  Left elbow pain.  Initial encounter. EXAM: LEFT ELBOW - COMPLETE 3+ VIEW COMPARISON:  None. FINDINGS: There is a comminuted intra-articular fracture of the olecranon with proximal distraction of the main proximal fragment. There is a large elbow joint effusion. There is no dislocation. No definite fracture is identified of the distal humerus or proximal radius. Soft tissue swelling is noted posteriorly at the elbow. IMPRESSION: Comminuted olecranon fracture. Electronically Signed   By: Sebastian AcheAllen  Grady M.D.   On:  05/08/2016 21:25    Positive ROS: All other systems have been reviewed and were otherwise negative with the exception of those mentioned in the HPI and as above.  Physical Exam: General:  Alert, no acute distress Psychiatric:  Patient is competent for consent with normal mood and affect   Cardiovascular:  No pedal edema Respiratory:  No wheezing, non-labored breathing GI:  Abdomen is soft and non-tender Skin:  No lesions in the area of chief complaint Neurologic:  Sensation intact distally Lymphatic:  No axillary or cervical lymphadenopathy  Orthopedic Exam:  Orthopedic examination is limited to the left upper extremity. The patient's arm is in a posterior splint maintaining the elbow at approximately 90 of flexion and forearm in neutral rotation. The splint appears to be intact and the patient has no complaints in the splint. She has moderate swelling to her fingers. She is neurovascularly intact to all digits.  X-rays:  X-rays of the left elbow are available for review. These films demonstrate a badly comminuted intra-articular fracture of the olecranon process with some distraction resulting in some proximal migration  of the olecranon tip. There does not appear to be any fractures of the radius or distal humerus. No significant degenerative changes of the stridor noted.  Assessment: Comminuted intra-articular fracture of left olecranon process.  Plan: The treatment options are discussed with the patient, including both surgical and nonsurgical options. The patient states that she is dealing with quite a bit healthwise right now as she is about to start dialysis and would like to avoid surgical intervention if at all possible. Given the severity of the injury, I explained to her that I could not guarantee that surgical intervention would provide significant improvement in her ultimate functional result over nonsurgical intervention and would expose her to infection and other risks of  surgery. The patient is leaning toward nonsurgical intervention, but would like to discuss this further with Dr. Martha ClanKrasinski who apparently is her regular orthopedic surgeon. I have spoken with map, the patient's nurse, who states that he received a call from Dr. Martha ClanKrasinski this evening. He plans on coming in to see the patient in the morning to discuss the various treatment options with her in detail as well. Therefore, I will defer further management of this delightful patient to Dr. Martha ClanKrasinski.  Thank you for asking me to participate in the care of this most delightful woman.   Maryagnes AmosJ. Jeffrey Ison Wichmann, MD  Beeper #:  [phone]  05/10/2016 7:15 PM

## 2016-05-11 DIAGNOSIS — I10 Essential (primary) hypertension: Secondary | ICD-10-CM

## 2016-05-11 DIAGNOSIS — N189 Chronic kidney disease, unspecified: Secondary | ICD-10-CM

## 2016-05-11 DIAGNOSIS — R0602 Shortness of breath: Secondary | ICD-10-CM

## 2016-05-11 LAB — BASIC METABOLIC PANEL
ANION GAP: 10 (ref 5–15)
BUN: 87 mg/dL — ABNORMAL HIGH (ref 6–20)
CALCIUM: 8.5 mg/dL — AB (ref 8.9–10.3)
CO2: 29 mmol/L (ref 22–32)
CREATININE: 3.29 mg/dL — AB (ref 0.44–1.00)
Chloride: 102 mmol/L (ref 101–111)
GFR, EST AFRICAN AMERICAN: 14 mL/min — AB (ref >60)
GFR, EST NON AFRICAN AMERICAN: 12 mL/min — AB (ref >60)
GLUCOSE: 107 mg/dL — AB (ref 65–99)
Potassium: 3 mmol/L — ABNORMAL LOW (ref 3.5–5.1)
Sodium: 141 mmol/L (ref 135–145)

## 2016-05-11 LAB — MAGNESIUM: MAGNESIUM: 2.1 mg/dL (ref 1.7–2.4)

## 2016-05-11 LAB — CBC
HCT: 26.5 % — ABNORMAL LOW (ref 35.0–47.0)
Hemoglobin: 8.7 g/dL — ABNORMAL LOW (ref 12.0–16.0)
MCH: 31.3 pg (ref 26.0–34.0)
MCHC: 32.8 g/dL (ref 32.0–36.0)
MCV: 95.4 fL (ref 80.0–100.0)
PLATELETS: 315 10*3/uL (ref 150–440)
RBC: 2.78 MIL/uL — AB (ref 3.80–5.20)
RDW: 16.2 % — ABNORMAL HIGH (ref 11.5–14.5)
WBC: 9 10*3/uL (ref 3.6–11.0)

## 2016-05-11 LAB — FERRITIN: FERRITIN: 198 ng/mL (ref 11–307)

## 2016-05-11 MED ORDER — IRBESARTAN 75 MG PO TABS
75.0000 mg | ORAL_TABLET | Freq: Every day | ORAL | Status: DC
  Administered 2016-05-11: 75 mg via ORAL
  Filled 2016-05-11: qty 1

## 2016-05-11 MED ORDER — TUBERCULIN PPD 5 UNIT/0.1ML ID SOLN
5.0000 [IU] | Freq: Once | INTRADERMAL | Status: AC
  Administered 2016-05-11: 5 [IU] via INTRADERMAL
  Filled 2016-05-11: qty 0.1

## 2016-05-11 NOTE — Plan of Care (Signed)
Problem: Activity: Goal: Risk for activity intolerance will decrease Outcome: Not Progressing Patient is bedridden.

## 2016-05-11 NOTE — Progress Notes (Signed)
Subjective:   Patient is known to our practice from outpatient and is followed for chronic kidney disease She presented for evaluation after a fall and is found to have elbow fracture Currently being managed nonsurgically Patient also reports that she has been losing weight, food doesn't taste right to her- has a metallic taste She was planning on home dialysis- [facility] but due to arm fracture, she will not be able to do it  Objective:  Vital signs in last 24 hours:  Temp:  [98.8 F (37.1 C)-99.3 F (37.4 C)] 99.3 F (37.4 C) (12/13 0530) Pulse Rate:  [68-84] 73 (12/13 0530) Resp:  [16-18] 18 (12/13 0530) BP: (116-127)/(44-72) 116/59 (12/13 0530) SpO2:  [97 %-100 %] 97 % (12/13 0530)  Weight change:  Filed Weights   05/10/16 1122  Weight: 62.6 kg (138 lb)    Intake/Output:    Intake/Output Summary (Last 24 hours) at 05/11/16 1343 Last data filed at 05/11/16 0631  Gross per 24 hour  Intake              846 ml  Output              750 ml  Net               96 ml     Physical Exam: General: No acute distress, elderly lady, laying in the bed  HEENT Anicteric moist mucous membranes  Neck Supple,  Pulm/lungs Normal breathing effort, clear to auscultation  CVS/Heart No rub or gallop  Abdomen:  Soft, nontender  Extremities: Left arm and elbow are in splint, no peripheral edema  Neurologic: Alert, oriented  Skin: No acute rashes          Basic Metabolic Panel:   Recent Labs Lab 05/10/16 1240 05/11/16 0500  NA 142 141  K 3.2* 3.0*  CL 103 102  CO2 28 29  GLUCOSE 103* 107*  BUN 87* 87*  CREATININE 3.36* 3.29*  CALCIUM 9.1 8.5*  MG  --  2.1     CBC:  Recent Labs Lab 05/10/16 1240 05/11/16 0500  WBC 9.6 9.0  NEUTROABS 7.7*  --   HGB 9.4* 8.7*  HCT 27.7* 26.5*  MCV 92.2 95.4  PLT 327 315      Microbiology:  No results found for this or any previous visit (from the past 720 hour(s)).  Coagulation Studies: No results for input(s): LABPROT, INR  in the last 72 hours.  Urinalysis: No results for input(s): COLORURINE, LABSPEC, PHURINE, GLUCOSEU, HGBUR, BILIRUBINUR, KETONESUR, PROTEINUR, UROBILINOGEN, NITRITE, LEUKOCYTESUR in the last 72 hours.  Invalid input(s): APPERANCEUR    Imaging: Dg Chest 2 View  Result Date: 05/10/2016 CLINICAL DATA:  Shortness of breath today EXAM: CHEST  2 VIEW COMPARISON:  01/08/2016 FINDINGS: There are small bilateral pleural effusions. There is mild atelectasis at the left lung base. No focal consolidation. Moderate cardiomegaly. No pneumothorax. Patchy peripheral opacities on the right could reflect pulmonary infiltrates. IMPRESSION: 1. Small bilateral pleural effusion. Patchy peripheral right lung opacities, possibly related to infiltrates 2. Moderate cardiomegaly without overt failure Electronically Signed   By: Jasmine PangKim  Fujinaga M.D.   On: 05/10/2016 14:25     Medications:   . sodium chloride 40 mL/hr at 05/11/16 0726   . amLODipine  5 mg Oral Daily  . aspirin EC  81 mg Oral Daily  . cloNIDine  0.1 mg Oral Daily  . heparin  5,000 Units Subcutaneous Q8H  . loratadine  10 mg Oral Daily  .  metoprolol succinate  100 mg Oral Daily  . senna  1 tablet Oral Daily   acetaminophen **OR** acetaminophen, ALPRAZolam, alum & mag hydroxide-simeth, docusate sodium, ondansetron **OR** ondansetron (ZOFRAN) IV, oxyCODONE, senna-docusate  Assessment/ Plan:  82 y.o. caucasian female with hypertension, hyperlipidemia, anxiety, chronic kidney disease stage V  1.  Chronic kidney disease stage V, with uremic symptoms, likely progress to end-stage renal disease Outpatient, Dr. Wynelle LinkKolluru has recommended starting dialysis to the patient due to mild uremic symptoms in the form of continued weight loss,loss of taste.  Initially, patient was planning on doing home peritoneal dialysis but due to recent elbow fracture, she will not be able to do that in the immediate future.  I have discussed starting hemodialysis with her.  She is  reluctant to read but still wants to discuss this with her children.  We will plan on n.p.o. After midnight tonight.  PermCath tomorrow morning. We will get her started on hemodialysis once access is available. - PPD - Hepatitis studies  2. Anemia of chronic kidney disease. Hemoglobin 8.7 Will check iron studies tomorrow  3. Hypokalemia Agree with holding furosemide for now no signs or symptoms of volume overload  4.  Hypertension Blood pressure 116/59 Will hold amlodipine and clonidine Start patient on irbesartan    LOS: 1 Lacey Smith 12/13/20171:43 PM

## 2016-05-11 NOTE — Progress Notes (Deleted)
Subjective:  Postop day #2 status post intramedullary fixation for right intertrochanteric hip fracture. Patient reports right hip pain as mild.  Patient denies any chest pain or increased shortness of breath. She has some shortness of breath at baseline on exertion and this is unchanged. She has no abdominal pain. She denies any nausea or vomiting. She is tolerating a by mouth diet. She is waiting to have a bowel movement. She is likely going to be discharged to a skilled nursing facility this afternoon.  Objective:   VITALS:   Vitals:   05/10/16 1454 05/10/16 1644 05/10/16 2000 05/11/16 0530  BP: (!) 118/52 120/72 (!) 127/44 (!) 116/59  Pulse: 68 84 68 73  Resp: 16 16 18 18   Temp:   98.8 F (37.1 C) 99.3 F (37.4 C)  TempSrc:   Oral Oral  SpO2: 100% 98% 97% 97%  Weight:      Height:        PHYSICAL EXAM:  Right lower extremity: Patient's dressing has been changed. There is a scant amount of sanguinous drainage on the proximal extent of her bandage. There is no active drainage seen from her incision through her honeycomb dressing. Her thigh compartments are soft and compressible as are her lower leg compartments. She has TED stockings and foot pumps in place. She has intact sensation to light touch, palpable pedal pulses and intact motor function.   LABS  Results for orders placed or performed during the hospital encounter of 05/10/16 (from the past 24 hour(s))  Basic metabolic panel     Status: Abnormal   Collection Time: 05/11/16  5:00 AM  Result Value Ref Range   Sodium 141 135 - 145 mmol/L   Potassium 3.0 (L) 3.5 - 5.1 mmol/L   Chloride 102 101 - 111 mmol/L   CO2 29 22 - 32 mmol/L   Glucose, Bld 107 (H) 65 - 99 mg/dL   BUN 87 (H) 6 - 20 mg/dL   Creatinine, Ser 2.953.29 (H) 0.44 - 1.00 mg/dL   Calcium 8.5 (L) 8.9 - 10.3 mg/dL   GFR calc non Af Amer 12 (L) >60 mL/min   GFR calc Af Amer 14 (L) >60 mL/min   Anion gap 10 5 - 15  CBC     Status: Abnormal   Collection Time:  05/11/16  5:00 AM  Result Value Ref Range   WBC 9.0 3.6 - 11.0 K/uL   RBC 2.78 (L) 3.80 - 5.20 MIL/uL   Hemoglobin 8.7 (L) 12.0 - 16.0 g/dL   HCT 62.126.5 (L) 30.835.0 - 65.747.0 %   MCV 95.4 80.0 - 100.0 fL   MCH 31.3 26.0 - 34.0 pg   MCHC 32.8 32.0 - 36.0 g/dL   RDW 84.616.2 (H) 96.211.5 - 95.214.5 %   Platelets 315 150 - 440 K/uL  Magnesium     Status: None   Collection Time: 05/11/16  5:00 AM  Result Value Ref Range   Magnesium 2.1 1.7 - 2.4 mg/dL    Dg Chest 2 View  Result Date: 05/10/2016 CLINICAL DATA:  Shortness of breath today EXAM: CHEST  2 VIEW COMPARISON:  01/08/2016 FINDINGS: There are small bilateral pleural effusions. There is mild atelectasis at the left lung base. No focal consolidation. Moderate cardiomegaly. No pneumothorax. Patchy peripheral opacities on the right could reflect pulmonary infiltrates. IMPRESSION: 1. Small bilateral pleural effusion. Patchy peripheral right lung opacities, possibly related to infiltrates 2. Moderate cardiomegaly without overt failure Electronically Signed   By: Adrian ProwsKim  Fujinaga M.D.  On: 05/10/2016 14:25    Assessment/Plan:     Active Problems:   Acute kidney injury [facility](HCC)  Patient is doing well from an orthopedic standpoint. Her hemoglobin and hematocrit are improved following a transfusion of 1 unit of packed red blood cells yesterday. Patient has no complaints today. She may be discharged from an orthopedic standpoint and will follow-up with me in my office in 10 days for staple removal, wound check and x-ray. Patient will be weightbearing as tolerated using her walker until her next follow-up. I recommend 4 weeks of Lovenox for DVT prophylaxis.    Juanell FairlyKRASINSKI, Kylani Wires , MD 05/11/2016, 12:59 PM

## 2016-05-11 NOTE — Progress Notes (Signed)
Patient ID: Lacey Smith Maglione, female   DOB: 1929/02/10, 82 y.o.   MRN: [id number]  [facility] PROGRESS NOTE  Lacey Smith Balint [id number] DOB: 1929/02/10 DOA: 05/10/2016 PCP: Imelda PillowHOLLAND, CHELSA, NP  HPI/Subjective: Patient complaining of left arm pain. Patient and her family are going to decide on dialysis. Patient urinating well. Some shortness of breath but she is anxious.  Objective: Vitals:   05/11/16 0530 05/11/16 1406  BP: (!) 116/59 (!) 130/46  Pulse: 73 62  Resp: 18 16  Temp: 99.3 F (37.4 C) 99.3 F (37.4 C)    Filed Weights   05/10/16 1122  Weight: 62.6 kg (138 lb)    ROS: Review of Systems  Constitutional: Negative for chills and fever.  Eyes: Negative for blurred vision.  Respiratory: Positive for shortness of breath. Negative for cough.   Cardiovascular: Negative for chest pain.  Gastrointestinal: Negative for abdominal pain, constipation, diarrhea, nausea and vomiting.  Genitourinary: Negative for dysuria.  Musculoskeletal: Positive for joint pain.  Neurological: Negative for dizziness and headaches.   Exam: Physical Exam  Constitutional: She is oriented to person, place, and time.  HENT:  Nose: No mucosal edema.  Mouth/Throat: No oropharyngeal exudate or posterior oropharyngeal edema.  Eyes: Conjunctivae, EOM and lids are normal. Pupils are equal, round, and reactive to light.  Neck: No JVD present. Carotid bruit is not present. No edema present. No thyroid mass and no thyromegaly present.  Cardiovascular: S1 normal and S2 normal.  Exam reveals no gallop.   Murmur heard.  Systolic murmur is present with a grade of 2/6  Pulses:      Dorsalis pedis pulses are 2+ on the right side, and 2+ on the left side.  Respiratory: No respiratory distress. She has decreased breath sounds in the right lower field and the left lower field. She has no wheezes. She has no rhonchi. She has no rales.  GI: Soft. Bowel sounds are normal. There is no tenderness.   Musculoskeletal:       Right ankle: She exhibits swelling.       Left ankle: She exhibits swelling.  Left arm in sling  Lymphadenopathy:    She has no cervical adenopathy.  Neurological: She is alert and oriented to person, place, and time. No cranial nerve deficit.  Skin: Skin is warm. No rash noted. Nails show no clubbing.  Psychiatric: She has a normal mood and affect.      Data Reviewed: Basic Metabolic Panel:  Recent Labs Lab 05/10/16 1240 05/11/16 0500  NA 142 141  K 3.2* 3.0*  CL 103 102  CO2 28 29  GLUCOSE 103* 107*  BUN 87* 87*  CREATININE 3.36* 3.29*  CALCIUM 9.1 8.5*  MG  --  2.1   Liver Function Tests:  Recent Labs Lab 05/10/16 1240  AST 60*  ALT 27  ALKPHOS 55  BILITOT 0.7  PROT 6.2*  ALBUMIN 2.5*   CBC:  Recent Labs Lab 05/10/16 1240 05/11/16 0500  WBC 9.6 9.0  NEUTROABS 7.7*  --   HGB 9.4* 8.7*  HCT 27.7* 26.5*  MCV 92.2 95.4  PLT 327 315     Studies: Dg Chest 2 View  Result Date: 05/10/2016 CLINICAL DATA:  Shortness of breath today EXAM: CHEST  2 VIEW COMPARISON:  01/08/2016 FINDINGS: There are small bilateral pleural effusions. There is mild atelectasis at the left lung base. No focal consolidation. Moderate cardiomegaly. No pneumothorax. Patchy peripheral opacities on the right could reflect pulmonary infiltrates. IMPRESSION: 1. Small bilateral pleural effusion. Patchy  peripheral right lung opacities, possibly related to infiltrates 2. Moderate cardiomegaly without overt failure Electronically Signed   By: Jasmine PangKim  Fujinaga M.D.   On: 05/10/2016 14:25    Scheduled Meds: . aspirin EC  81 mg Oral Daily  . heparin  5,000 Units Subcutaneous Q8H  . irbesartan  75 mg Oral QHS  . metoprolol succinate  100 mg Oral Daily  . senna  1 tablet Oral Daily  . tuberculin  5 Units Intradermal Once    Assessment/Plan:  1. Acute on chronic kidney disease stage V with uremic symptoms. Progressing to end-stage renal disease. Patient having a  weight loss. Nephrology to start hemodialysis. Nothing by mouth after midnight for PermCath tomorrow morning. Stop IV fluid hydration 2. Anemia of chronic disease with hemoglobin of 8.7. Stop IV fluid hydration. 3. Essential hypertension. Continue metoprolol. Nephrology changed to irbesartan. 4. History of psoriasis 5. Shortness of breath. Could be fluid overload or anxiety. Continue to monitor. Stop IV fluids. 6. Hypokalemia will not treat with chronic kidney disease 7. Displaced fracture of the left olecranon. Patient wants to do conservative management. Seen by orthopedic surgery. When necessary pain medication. Follow-up as outpatient 2 weeks.  Code Status:     Code Status Orders        Start     Ordered   05/10/16 1750  Do not attempt resuscitation (DNR)  Continuous    Question Answer Comment  In the event of cardiac or respiratory ARREST Do not call a "code blue"   In the event of cardiac or respiratory ARREST Do not perform Intubation, CPR, defibrillation or ACLS   In the event of cardiac or respiratory ARREST Use medication by any route, position, wound care, and other measures to relive pain and suffering. May use oxygen, suction and manual treatment of airway obstruction as needed for comfort.      05/10/16 1750    Code Status History    Date Active Date Inactive Code Status Order ID Comments User Context   02/19/2015  7:05 PM 02/24/2015  4:59 PM DNR [id number]  Alford Highlandichard Kyra Laffey, MD ED   02/19/2015  6:31 PM 02/19/2015  7:05 PM Full Code [id number]  Alford Highlandichard Daymon Hora, MD ED     Family Communication: Son at the bedside Disposition Plan: We will need 3 days of dialysis prior to disposition and outpatient dialysis slot  Consultants:  Nephrology  Orthopedic surgery  Vascular surgery  Time spent: 28 minutes  Alford HighlandWIETING, Sybrina Laning  [employer]

## 2016-05-11 NOTE — Consult Note (Signed)
ORTHOPAEDIC CONSULTATION  REQUESTING PHYSICIAN: Alford Highlandichard Wieting, MD  Chief Complaint: Left closed olecranon fracture  HPI: Lacey Smith is a 82 y.o. female who complains of pain in the left elbow s/p fall.  She presented to the ER on the day of injury 05/08/16.  Patient returned to the hospital on 05/10/16 with acute renal failure.  She is admitted for acute renal failure.  She was evaluated by Dr. Joice LoftsPoggi from orthopaedics yesterday.  She had requested my opinion on her injury as I have previously cared for her for another orthopaedic issue.  Patient's son is at the bedside.  She denies numbness or tingling, but is having pain in the left elbow.  Patient is being set up for hemodialysis due to kidney failure.  Past Medical History:  Diagnosis Date  . Anxiety   . Hypertension   . Hypertriglyceridemia   . Neuropathy (HCC)   . Psoriasis   . Renal disorder    Past Surgical History:  Procedure Laterality Date  . Cataracts    . JOINT REPLACEMENT     Social History   Social History  . Marital status: Widowed    Spouse name: N/A  . Number of children: N/A  . Years of education: N/A   Social History Main Topics  . Smoking status: Former Smoker    Years: 55.00    Quit date: 02/18/2005  . Smokeless tobacco: Never Used  . Alcohol use No  . Drug use: No  . Sexual activity: Not Asked   Other Topics Concern  . None   Social History Narrative  . None   Family History  Problem Relation Age of Onset  . Heart failure Mother   . Heart attack Father   . Rheumatic fever Father    Allergies  Allergen Reactions  . Sulfa Antibiotics    Prior to Admission medications   Medication Sig Start Date End Date Taking? Authorizing Provider  acetaminophen (TYLENOL) 325 MG tablet Take 2 tablets (650 mg total) by mouth every 6 (six) hours as needed for mild pain (or Fever >/= 101). 02/24/15  Yes Auburn BilberryShreyang Patel, MD  ALPRAZolam Prudy Feeler(XANAX) 0.25 MG tablet Take 0.25 mg by mouth 2 (two) times daily as  needed for anxiety.   Yes Historical Provider, MD  ALPRAZolam (XANAX) 0.25 MG tablet Take 1 tablet (0.25 mg total) by mouth 3 (three) times daily as needed for anxiety. 02/24/15  Yes Auburn BilberryShreyang Patel, MD  alum & mag hydroxide-simeth (MAALOX/MYLANTA) 200-200-20 MG/5ML suspension Take 30 mLs by mouth every 6 (six) hours as needed for indigestion or heartburn. 02/24/15  Yes Auburn BilberryShreyang Patel, MD  amLODipine (NORVASC) 10 MG tablet Take 0.5 tablets (5 mg total) by mouth daily. Patient taking differently: Take 10 mg by mouth daily.  02/24/15  Yes Auburn BilberryShreyang Patel, MD  aspirin EC 81 MG EC tablet Take 1 tablet (81 mg total) by mouth daily. 02/24/15  Yes Auburn BilberryShreyang Patel, MD  cloNIDine (CATAPRES) 0.2 MG tablet Take 0.5 tablets (0.1 mg total) by mouth 2 (two) times daily. Patient taking differently: Take 0.1 mg by mouth daily.  02/24/15  Yes Auburn BilberryShreyang Patel, MD  docusate sodium (COLACE) 100 MG capsule Take 2 capsules (200 mg total) by mouth 2 (two) times daily. 02/24/15  Yes Auburn BilberryShreyang Patel, MD  fenofibrate (TRICOR) 145 MG tablet Take 145 mg by mouth daily.   Yes Historical Provider, MD  furosemide (LASIX) 20 MG tablet Take 40 mg by mouth daily.   Yes Historical Provider, MD  lisinopril (PRINIVIL,ZESTRIL) 40 MG  tablet Take 40 mg by mouth daily.   Yes Historical Provider, MD  metoprolol succinate (TOPROL-XL) 100 MG 24 hr tablet Take 100 mg by mouth daily. Take with or immediately following a meal.   Yes Historical Provider, MD  ondansetron (ZOFRAN) 4 MG tablet Take 1 tablet (4 mg total) by mouth every 8 (eight) hours as needed for nausea or vomiting. 02/24/15  Yes Auburn BilberryShreyang Patel, MD  senna (SENOKOT) 8.6 MG TABS tablet Take 1 tablet (8.6 mg total) by mouth daily. 02/24/15  Yes Auburn BilberryShreyang Patel, MD  traMADol (ULTRAM) 50 MG tablet Take 1 tablet (50 mg total) by mouth every 6 (six) hours as needed for moderate pain. 02/24/15  Yes Auburn BilberryShreyang Patel, MD  oxyCODONE (OXY IR/ROXICODONE) 5 MG immediate release tablet Take 1 tablet (5 mg total)  by mouth every 4 (four) hours as needed for moderate pain or severe pain. Patient not taking: Reported on 05/10/2016 02/24/15   Auburn BilberryShreyang Patel, MD   Dg Chest 2 View  Result Date: 05/10/2016 CLINICAL DATA:  Shortness of breath today EXAM: CHEST  2 VIEW COMPARISON:  01/08/2016 FINDINGS: There are small bilateral pleural effusions. There is mild atelectasis at the left lung base. No focal consolidation. Moderate cardiomegaly. No pneumothorax. Patchy peripheral opacities on the right could reflect pulmonary infiltrates. IMPRESSION: 1. Small bilateral pleural effusion. Patchy peripheral right lung opacities, possibly related to infiltrates 2. Moderate cardiomegaly without overt failure Electronically Signed   By: Jasmine PangKim  Fujinaga M.D.   On: 05/10/2016 14:25    Positive ROS: All other systems have been reviewed and were otherwise negative with the exception of those mentioned in the HPI and as above.  Physical Exam: General: Alert, no acute distress Skin: Intact Neurologic: Sensation intact distally  MUSCULOSKELETAL: Left elbow:  Patient's splint and bandages were removed for examination.  Patient has swelling and ecchymosis over the posterior left elbow.  Patient has a 1 inch superficial skin tear laterally.  There are no signs of infection including no erythema or drainage.  Patient has tenderness over the posterior elbow.  She has intact sensation throughout the left upper extremity.  Patient can flex and extend her digits and wrist without limitation, but elbow motion is severely limited due to pain and swelling.    Assessment: Left closed olecranon fracture.  Plan: I explained to the patient that she has a displaced, comminuted fracture of the left olecranon.  I discussed with the patient and her son the treatment options.  These included operative and non-operative intervention.   With non-operative management she can expect weakness of left elbow extension.  Patient states that with her current  kidney issues she doesn't want to have surgery.  The plan is to continue splint treatment.  I placed a dry sterile dressing over her skin laceration and rewrapped her in webril and replaced her splint.  I recommend that next week she discontinue the sling and begin on gentle elbow ROM.   She can continue to use a sling for comfort.  I recommend she follow up with me in the office in approximately 2 weeks.      Juanell FairlyKRASINSKI, Martrell Eguia, MD    05/11/2016 2:39 PM

## 2016-05-11 NOTE — Progress Notes (Signed)
Initial Nutrition Assessment  DOCUMENTATION CODES:   Not applicable  INTERVENTION:   Magic cup TID with meals, each supplement provides 290 kcal and 9 grams of protein  snacks  NUTRITION DIAGNOSIS:   Inadequate oral intake related to poor appetite, other (see comment) (taste changes) as evidenced by per patient/family report, moderate depletions of muscle mass.  GOAL:   Patient will meet greater than or equal to 90% of their needs  MONITOR:   PO intake, Supplement acceptance  REASON FOR ASSESSMENT:   Malnutrition Screening Tool    ASSESSMENT:   82 y.o. female with a known history of CKD stage 4. Was sent from her nephrologist due to acute kidney injury and follow-up for her elbow fracture.   Met with pt in room today. Pt reports poor appetite for several months pta. Pt reports that all of her food tastes metallic. Pt reports that eating frozen grapes prior to her meal helps improve the taste of her foods. (spoke to patient Risk analyst; pt to receive frozen grapes on all meal trays) Pt reports that she has lost 17lbs since January. This is not considered significant. Pt has consult 12/14 to start HD; possibly just temporarily. Pt likes cold things; will order magic cups and fruit snacks.   Medications reviewed and include: aspirin, heparin, senokot, oxycodone  Labs reviewed: K 3.0(L), BUN 87(H), creat 3.29(H), Ca 8.5 (l), Alb 2.5(L) 12/12, AST 60(H) 12/12 Hgb 8.7(L), Hct 26.5(L)  Nutrition-Focused physical exam completed. Findings are no fat depletion, moderate muscle depletion, and mild edema.   Diet Order:  Diet renal with fluid restriction Fluid restriction: 1500 mL Fluid; Room service appropriate? Yes; Fluid consistency: Thin  Skin:  Reviewed, no issues  Last BM:  12/10  Height:   Ht Readings from Last 1 Encounters:  05/10/16 5' 4"  (1.626 m)    Weight:   Wt Readings from Last 1 Encounters:  05/10/16 138 lb (62.6 kg)    Ideal Body Weight:  54.5  kg  BMI:  Body mass index is 23.69 kg/m.  Estimated Nutritional Needs:   Kcal:  1400-1600kcalday   Protein:  75-88g/day   Fluid:  1.5L/day or per MD discretion   EDUCATION NEEDS:   No education needs identified at this time  [name], RD, LDN

## 2016-05-11 NOTE — Consult Note (Signed)
[facility] Vascular Consult Note  MRN : [id number]  Lacey Smith is a 82 y.o. (18-Nov-1928) female who presents with chief complaint of  Chief Complaint  Patient presents with  . Dizziness   History of Present Illness:  Consulted by nephrology, Dr. Thedore MinsSingh for permcath placement.   Lacey Smith is a 82 year old female who [facility] via EMS on 05/10/16 after having a fall on Sunday. During the fall, the patient sustained a right-sided comminuted olecranon fracture. Patient was discharged home Sunday and referred back to the emergency department by nephrology for evaluation of some increasing renal insufficiency/renal failure.   The patient endorses episodes of feeling "dizzy" and "nauseated". She describes her "dizziness" was somewhat vertiginous. She is complaining of a metallic taste in her mouth and some shortness of breath. She is supposed to start dialysis in the near future.   Patient states a meeting is to take place tomorrow between patient, family and nephrology to discuss starting dialysis. Patient states she will not be ready for dialysis until Friday 05/13/16.  Current Facility-Administered Medications  Medication Dose Route Frequency Provider Last Rate Last Dose  . acetaminophen (TYLENOL) tablet 650 mg  650 mg Oral Q6H PRN Adrian SaranSital Mody, MD       Or  . acetaminophen (TYLENOL) suppository 650 mg  650 mg Rectal Q6H PRN Adrian SaranSital Mody, MD      . ALPRAZolam Prudy Feeler(XANAX) tablet 0.25 mg  0.25 mg Oral BID PRN Adrian SaranSital Mody, MD      . alum & mag hydroxide-simeth (MAALOX/MYLANTA) 200-200-20 MG/5ML suspension 30 mL  30 mL Oral Q6H PRN Adrian SaranSital Mody, MD      . aspirin EC tablet 81 mg  81 mg Oral Daily Sital Mody, MD   81 mg at 05/11/16 1007  . docusate sodium (COLACE) capsule 200 mg  200 mg Oral BID PRN Adrian SaranSital Mody, MD      . heparin injection 5,000 Units  5,000 Units Subcutaneous Q8H Adrian SaranSital Mody, MD   5,000 Units at 05/11/16 0519  . irbesartan (AVAPRO) tablet 75 mg   75 mg Oral QHS Harmeet Singh, MD      . metoprolol succinate (TOPROL-XL) 24 hr tablet 100 mg  100 mg Oral Daily Sital Mody, MD   100 mg at 05/11/16 1007  . ondansetron (ZOFRAN) tablet 4 mg  4 mg Oral Q6H PRN Adrian SaranSital Mody, MD       Or  . ondansetron (ZOFRAN) injection 4 mg  4 mg Intravenous Q6H PRN Adrian SaranSital Mody, MD      . oxyCODONE (Oxy IR/ROXICODONE) immediate release tablet 5 mg  5 mg Oral Q4H PRN Adrian SaranSital Mody, MD   5 mg at 05/11/16 1212  . senna (SENOKOT) tablet 8.6 mg  1 tablet Oral Daily Sital Mody, MD   8.6 mg at 05/11/16 1007  . senna-docusate (Senokot-S) tablet 1 tablet  1 tablet Oral QHS PRN Adrian SaranSital Mody, MD   1 tablet at 05/10/16 2031  . tuberculin injection 5 Units  5 Units Intradermal Once Mosetta PigeonHarmeet Singh, MD   5 Units at 05/11/16 1750   Past Medical History:  Diagnosis Date  . Anxiety   . Hypertension   . Hypertriglyceridemia   . Neuropathy (HCC)   . Psoriasis   . Renal disorder    Past Surgical History:  Procedure Laterality Date  . Cataracts    . JOINT REPLACEMENT     Social History Social History  Substance Use Topics  . Smoking status: Former  Smoker    Years: 55.00    Quit date: 02/18/2005  . Smokeless tobacco: Never Used  . Alcohol use No   Family History Family History  Problem Relation Age of Onset  . Heart failure Mother   . Heart attack Father   . Rheumatic fever Father   Denies family history of PAD, renal or clotting / bleeding disorder.  Allergies  Allergen Reactions  . Sulfa Antibiotics    REVIEW OF SYSTEMS (Negative unless checked)  Constitutional: [] Weight loss  [] Fever  [] Chills Cardiac: [] Chest pain   [] Chest pressure   [] Palpitations   [x] Shortness of breath when laying flat   [] Shortness of breath at rest   [x] Shortness of breath with exertion. Vascular:  [] Pain in legs with walking   [] Pain in legs at rest   [] Pain in legs when laying flat   [] Claudication   [] Pain in feet when walking  [] Pain in feet at rest  [] Pain in feet when laying flat    [] History of DVT   [] Phlebitis   [x] Swelling in legs   [] Varicose veins   [] Non-healing ulcers Pulmonary:   [] Uses home oxygen   [] Productive cough   [] Hemoptysis   [] Wheeze  [] COPD   [] Asthma Neurologic:  [x] Dizziness  [] Blackouts   [] Seizures   [] History of stroke   [] History of TIA  [] Aphasia   [] Temporary blindness   [] Dysphagia   [] Weakness or numbness in arms   [] Weakness or numbness in legs Musculoskeletal:  [] Arthritis   [] Joint swelling   [] Joint pain   [] Low back pain Hematologic:  [] Easy bruising  [] Easy bleeding   [] Hypercoagulable state   [] Anemic  [] Hepatitis Gastrointestinal:  [] Blood in stool   [] Vomiting blood  [] Gastroesophageal reflux/heartburn   [] Difficulty swallowing. Genitourinary:  [x] Chronic kidney disease   [] Difficult urination  [] Frequent urination  [] Burning with urination   [] Blood in urine Skin:  [] Rashes   [] Ulcers   [] Wounds Psychological:  [] History of anxiety   []  History of major depression.  Physical Examination  Vitals:   05/10/16 1644 05/10/16 2000 05/11/16 0530 05/11/16 1406  BP: 120/72 (!) 127/44 (!) 116/59 (!) 130/46  Pulse: 84 68 73 62  Resp: 16 18 18 16   Temp:  98.8 F (37.1 C) 99.3 F (37.4 C) 99.3 F (37.4 C)  TempSrc:  Oral Oral Oral  SpO2: 98% 97% 97% 97%  Weight:      Height:       Body mass index is 23.69 kg/m. Gen:  WD/WN, NAD Head: [place]/AT, No temporalis wasting. Prominent temp pulse not noted. Ear/Nose/Throat: Hearing grossly intact, nares w/o erythema or drainage, oropharynx w/o Erythema/Exudate Eyes: Sclera non-icteric, conjunctiva clear Neck: Trachea midline.  No JVD.  Pulmonary:  Good air movement, respirations not labored, equal bilaterally.  Cardiac: RRR, normal S1, S2. Vascular:  Vessel Right Left  Radial Palpable Palpable  Ulnar Palpable Palpable  Brachial Palpable Palpable  Carotid Palpable, without bruit Palpable, without bruit  Aorta Not palpable N/A  Femoral Palpable Palpable  Popliteal Palpable Palpable  PT  Palpable Palpable  DP Palpable Palpable   Left upper extremity: in sling  Gastrointestinal: soft, non-tender/non-distended. No guarding/reflex.  Musculoskeletal: M/S 5/5 throughout.  Extremities without ischemic changes.  No deformity or atrophy. Minimal edema. Neurologic: Sensation grossly intact in extremities.  Symmetrical.  Speech is fluent. Motor exam as listed above. Psychiatric: Judgment intact, Mood & affect appropriate for pt's clinical situation. Dermatologic: No rashes or ulcers noted.  No cellulitis or open wounds. Lymph : No Cervical,  Axillary, or Inguinal lymphadenopathy.  CBC Lab Results  Component Value Date   WBC 9.0 05/11/2016   HGB 8.7 (L) 05/11/2016   HCT 26.5 (L) 05/11/2016   MCV 95.4 05/11/2016   PLT 315 05/11/2016   BMET    Component Value Date/Time   NA 141 05/11/2016 0500   K 3.0 (L) 05/11/2016 0500   CL 102 05/11/2016 0500   CO2 29 05/11/2016 0500   GLUCOSE 107 (H) 05/11/2016 0500   BUN 87 (H) 05/11/2016 0500   CREATININE 3.29 (H) 05/11/2016 0500   CALCIUM 8.5 (L) 05/11/2016 0500   GFRNONAA 12 (L) 05/11/2016 0500   GFRAA 14 (L) 05/11/2016 0500   Estimated Creatinine Clearance: 10.8 mL/min (by C-G formula based on SCr of 3.29 mg/dL (H)).  COAG No results found for: INR, PROTIME  Radiology Dg Chest 2 View  Result Date: 05/10/2016 CLINICAL DATA:  Shortness of breath today EXAM: CHEST  2 VIEW COMPARISON:  01/08/2016 FINDINGS: There are small bilateral pleural effusions. There is mild atelectasis at the left lung base. No focal consolidation. Moderate cardiomegaly. No pneumothorax. Patchy peripheral opacities on the right could reflect pulmonary infiltrates. IMPRESSION: 1. Small bilateral pleural effusion. Patchy peripheral right lung opacities, possibly related to infiltrates 2. Moderate cardiomegaly without overt failure Electronically Signed   By: Jasmine PangKim  Fujinaga M.D.   On: 05/10/2016 14:25   Dg Elbow Complete Left (3+view)  Result Date:  05/08/2016 CLINICAL DATA:  Fall.  Left elbow pain.  Initial encounter. EXAM: LEFT ELBOW - COMPLETE 3+ VIEW COMPARISON:  None. FINDINGS: There is a comminuted intra-articular fracture of the olecranon with proximal distraction of the main proximal fragment. There is a large elbow joint effusion. There is no dislocation. No definite fracture is identified of the distal humerus or proximal radius. Soft tissue swelling is noted posteriorly at the elbow. IMPRESSION: Comminuted olecranon fracture. Electronically Signed   By: Sebastian AcheAllen  Grady M.D.   On: 05/08/2016 21:25   Assessment/Plan Lacey Smith is a 82 year old female who [facility] on 05/10/16 after having a fall on Sunday. During the fall, the patient sustained a right-sided comminuted olecranon fracture. Patient was discharged home Sunday and referred back to the emergency department by nephrology for evaluation of some increasing renal insufficiency/renal failure - stable  1. ESRD: Patient will need access to start dialysis while awaiting either fistula for HD or PD. Will place permcath after family meeting on Friday as per patients wishes. Procedure, risks and benefits explained to patient. All questions answered. Patient wishes to proceed on Friday.  2. Hypertension: Encouraged good control as its slows the progression of atherosclerotic and renal disease 3. Hypertriglyceridemia: Encouraged good control as its slows the progression of atherosclerotic and renal disease  Discussed with Dr. Weldon Inchesew  Ayomide Zuleta A Louisa Favaro, PA-C  05/11/2016 6:14 PM

## 2016-05-12 ENCOUNTER — Inpatient Hospital Stay: Payer: Medicare Other

## 2016-05-12 ENCOUNTER — Inpatient Hospital Stay (HOSPITAL_COMMUNITY)
Admit: 2016-05-12 | Discharge: 2016-05-12 | Disposition: A | Discharge status: Home / Self Care | Payer: Medicare Other | Attending: Critical Care Medicine | Admitting: Critical Care Medicine

## 2016-05-12 DIAGNOSIS — J9601 Acute respiratory failure with hypoxia: Secondary | ICD-10-CM

## 2016-05-12 DIAGNOSIS — I349 Nonrheumatic mitral valve disorder, unspecified: Secondary | ICD-10-CM

## 2016-05-12 DIAGNOSIS — J9602 Acute respiratory failure with hypercapnia: Secondary | ICD-10-CM

## 2016-05-12 LAB — IRON AND TIBC
IRON: 38 ug/dL (ref 28–170)
SATURATION RATIOS: 15 % (ref 10.4–31.8)
TIBC: 249 ug/dL — ABNORMAL LOW (ref 250–450)
UIBC: 211 ug/dL

## 2016-05-12 LAB — BASIC METABOLIC PANEL
Anion gap: 8 (ref 5–15)
BUN: 89 mg/dL — ABNORMAL HIGH (ref 6–20)
CALCIUM: 8.5 mg/dL — AB (ref 8.9–10.3)
CO2: 28 mmol/L (ref 22–32)
CREATININE: 3.63 mg/dL — AB (ref 0.44–1.00)
Chloride: 107 mmol/L (ref 101–111)
GFR calc Af Amer: 12 mL/min — ABNORMAL LOW (ref >60)
GFR calc non Af Amer: 11 mL/min — ABNORMAL LOW (ref >60)
GLUCOSE: 142 mg/dL — AB (ref 65–99)
Potassium: 3 mmol/L — ABNORMAL LOW (ref 3.5–5.1)
Sodium: 143 mmol/L (ref 135–145)

## 2016-05-12 LAB — MRSA PCR SCREENING: MRSA by PCR: NEGATIVE

## 2016-05-12 LAB — GLUCOSE, CAPILLARY: GLUCOSE-CAPILLARY: 111 mg/dL — AB (ref 65–99)

## 2016-05-12 MED ORDER — POTASSIUM CHLORIDE CRYS ER 20 MEQ PO TBCR
40.0000 meq | EXTENDED_RELEASE_TABLET | Freq: Once | ORAL | Status: AC
  Administered 2016-05-12: 40 meq via ORAL
  Filled 2016-05-12: qty 2

## 2016-05-12 MED ORDER — CALCIUM GLUCONATE 10 % IV SOLN
INTRAVENOUS | Status: AC
  Filled 2016-05-12: qty 30

## 2016-05-12 MED ORDER — AMIODARONE LOAD VIA INFUSION
150.0000 mg | Freq: Once | INTRAVENOUS | Status: AC
  Administered 2016-05-12: 150 mg via INTRAVENOUS
  Filled 2016-05-12: qty 83.34

## 2016-05-12 MED ORDER — SODIUM CHLORIDE 0.9 % IV SOLN
Freq: Once | INTRAVENOUS | Status: AC
Start: 2016-05-12 — End: 2016-05-12
  Administered 2016-05-12: 15:00:00 via INTRAVENOUS

## 2016-05-12 MED ORDER — AMIODARONE IV BOLUS ONLY 150 MG/100ML
150.0000 mg | Freq: Once | INTRAVENOUS | Status: AC
  Administered 2016-05-12: 150 mg via INTRAVENOUS
  Filled 2016-05-12: qty 100

## 2016-05-12 MED ORDER — AMIODARONE LOAD VIA INFUSION: 150.0000 mg | Freq: Once | INTRAVENOUS | Status: DC

## 2016-05-12 MED ORDER — AMIODARONE HCL IN DEXTROSE 360-4.14 MG/200ML-% IV SOLN
30.0000 mg/h | INTRAVENOUS | Status: DC
  Administered 2016-05-14: 30 mg/h via INTRAVENOUS
  Filled 2016-05-12: qty 200

## 2016-05-12 MED ORDER — CEFAZOLIN IN D5W 1 GM/50ML IV SOLN
1.0000 g | Freq: Once | INTRAVENOUS | Status: AC
Start: 2016-05-13 — End: 2016-05-13
  Administered 2016-05-13: 1 g via INTRAVENOUS
  Filled 2016-05-12: qty 50

## 2016-05-12 MED ORDER — AMIODARONE HCL IN DEXTROSE 360-4.14 MG/200ML-% IV SOLN
60.0000 mg/h | INTRAVENOUS | Status: AC
  Administered 2016-05-12 (×2): 60 mg/h via INTRAVENOUS
  Filled 2016-05-12: qty 200

## 2016-05-12 NOTE — Progress Notes (Addendum)
[employer] [facility] [facility] [place] [facility]   PATIENT NAME: Lacey DanceRebecca Smith    MR#:  [id number]  DATE OF BIRTH:  January 07, 1929  SUBJECTIVE:  Patient doing well this morning. She was hungry. She voices some mild shortness of breath. Pain is controlled from elbow fracture  REVIEW OF SYSTEMS:    Review of Systems  Constitutional: Negative.  Negative for chills, fever and malaise/fatigue.  HENT: Negative.  Negative for ear discharge, ear pain, hearing loss, nosebleeds and sore throat.   Eyes: Negative.  Negative for blurred vision and pain.  Respiratory: Positive for shortness of breath. Negative for cough, hemoptysis and wheezing.   Cardiovascular: Negative.  Negative for chest pain, palpitations and leg swelling.  Gastrointestinal: Negative.  Negative for abdominal pain, blood in stool, diarrhea, nausea and vomiting.  Genitourinary: Negative.  Negative for dysuria.  Musculoskeletal: Positive for joint pain. Negative for back pain.  Skin: Negative.   Neurological: Negative for dizziness, tremors, speech change, focal weakness, seizures and headaches.  Endo/Heme/Allergies: Negative.  Does not bruise/bleed easily.  Psychiatric/Behavioral: Negative.  Negative for depression, hallucinations and suicidal ideas.    Tolerating Diet: yes      DRUG ALLERGIES:   Allergies  Allergen Reactions  . Sulfa Antibiotics     VITALS:  Blood pressure (!) 162/56, pulse 78, temperature 98.8 F (37.1 C), temperature source Oral, resp. rate 19, height 5\' 4"  (1.626 m), weight 62.6 kg (138 lb), SpO2 95 %.  PHYSICAL EXAMINATION:   Physical Exam  Constitutional: She is oriented to person, place, and time and well-developed, well-nourished, and in no distress. No distress.  HENT:  Head: Normocephalic.  Eyes: No scleral icterus.  Neck: Normal range of motion. Neck supple. No JVD present. No tracheal deviation present.  Cardiovascular: Normal rate, regular rhythm and normal heart sounds.  Exam  reveals no gallop and no friction rub.   No murmur heard. Pulmonary/Chest: Effort normal. No respiratory distress. She has no wheezes. She has no rales. She exhibits no tenderness.  Decreased both bases  Abdominal: Soft. Bowel sounds are normal. She exhibits no distension and no mass. There is no tenderness. There is no rebound and no guarding.  Musculoskeletal: Normal range of motion. She exhibits edema.  Left elbow in sling   Neurological: She is alert and oriented to person, place, and time.  Skin: Skin is warm. No rash noted. No erythema.  Psychiatric: Affect and judgment normal.      LABORATORY PANEL:   CBC  Recent Labs Lab 05/11/16 0500  WBC 9.0  HGB 8.7*  HCT 26.5*  PLT 315   ------------------------------------------------------------------------------------------------------------------  Chemistries   Recent Labs Lab 05/10/16 1240 05/11/16 0500 05/12/16 0500  NA 142 141 143  K 3.2* 3.0* 3.0*  CL 103 102 107  CO2 28 29 28   GLUCOSE 103* 107* 142*  BUN 87* 87* 89*  CREATININE 3.36* 3.29* 3.63*  CALCIUM 9.1 8.5* 8.5*  MG  --  2.1  --   AST 60*  --   --   ALT 27  --   --   ALKPHOS 55  --   --   BILITOT 0.7  --   --    ------------------------------------------------------------------------------------------------------------------  Cardiac Enzymes No results for input(s): TROPONINI in the last 168 hours. ------------------------------------------------------------------------------------------------------------------  RADIOLOGY:  Dg Chest 2 View  Result Date: 05/10/2016 CLINICAL DATA:  Shortness of breath today EXAM: CHEST  2 VIEW COMPARISON:  01/08/2016 FINDINGS: There are small bilateral pleural effusions. There is mild atelectasis at  the left lung base. No focal consolidation. Moderate cardiomegaly. No pneumothorax. Patchy peripheral opacities on the right could reflect pulmonary infiltrates. IMPRESSION: 1. Small bilateral pleural effusion. Patchy  peripheral right lung opacities, possibly related to infiltrates 2. Moderate cardiomegaly without overt failure Electronically Signed   By: Jasmine PangKim  Fujinaga M.D.   On: 05/10/2016 14:25     ASSESSMENT AND PLAN:   82 year old female chronic kidney disease stage V status post mechanical fall on December 10 with a left elbow fracture who presents with worsening of creatinine.   1. Chronic kidney disease stage V with uremic symptoms progressing to end-stage renal disease: Patient to speak with her family about initiating dialysis. If she would like to be initiated on dialysis then plan is for nothing by mouth after midnight and permacath tomorrow.  2. Anemia of chronic kidney disease:  3. Left closed OLECRON fracture: Patient has decided on conservative therapy continue supportive management Next week she should discontinue sling and begin on gentle elbow range of motion exercises.   4. Hypokalemia: Gentle replacement   Management plans discussed with the patient and she is in agreement.  CODE STATUS: DNR  TOTAL TIME TAKING CARE OF THIS PATIENT: 24 minutes.     POSSIBLE D/C ??, DEPENDING ON CLINICAL CONDITION.   Roza Creamer M.D on 05/12/2016 at 11:45 AM  Between 7am to 6pm - Pager - [phone] After 6pm go to [email or web address] - password [email or web address]  [facility]  Office  [phone]  CC: Primary care physician; Imelda PillowHOLLAND, CHELSA, NP  Note: This dictation was prepared with Dragon dictation along with smaller phrase technology. Any transcriptional errors that result from this process are unintentional.

## 2016-05-12 NOTE — Progress Notes (Signed)
PT Cancellation Note  Patient Details Name: Lacey DanceRebecca Sinn MRN: [id number] DOB: February 02, 1929   Cancelled Treatment:    Reason Eval/Treat Not Completed: Other (comment). Pt now in CCU secondary to Afib with RVR. Secondary to change in status, will need new PT orders to resume care. Will cancel current orders. Please re-order when appropriate.   Altus Zaino 05/12/2016, 4:46 PM  Elizabeth PalauStephanie Elah Avellino, PT, DPT [phone]

## 2016-05-12 NOTE — Progress Notes (Signed)
Subjective:   Patient is known to our practice from outpatient and is followed for chronic kidney disease She presented for evaluation after a fall and is found to have elbow fracture Currently being managed nonsurgically Patient also reports that she has been losing weight, food doesn't taste right to her- has a metallic taste She was planning on home dialysis- PD but due to arm fracture, she will not be able to do it  We had a family meeting today with patient, her daughter, son and her daughter-in-law.  We discussed about indications for dialysis.  We also discussed about her frailty and difficulty that presents for her in the near-term future. Patient states that she does not feel well today.  She is slightly nauseous.  Objective:  Vital signs in last 24 hours:  Temp:  [98.4 F (36.9 C)-98.8 F (37.1 C)] 98.4 F (36.9 C) (12/14 1233) Pulse Rate:  [71-132] 132 (12/14 1345) Resp:  [17-22] 22 (12/14 1559) BP: (94-162)/(50-63) 94/50 (12/14 1559) SpO2:  [95 %-97 %] 97 % (12/14 1233)  Weight change:  Filed Weights   05/10/16 1122  Weight: 62.6 kg (138 lb)    Intake/Output:    Intake/Output Summary (Last 24 hours) at 05/12/16 1714 Last data filed at 05/12/16 1148  Gross per 24 hour  Intake              380 ml  Output             1000 ml  Net             -620 ml     Physical Exam: General: No acute distress, elderly lady, laying in the bed  HEENT Anicteric moist mucous membranes  Neck Supple,  Pulm/lungs Normal breathing effort, bilateral crackles  CVS/Heart No rub or gallop, tachycardic, irregular  Abdomen:  Soft, nontender  Extremities: Left arm and elbow are in splint, trace peripheral edema  Neurologic: Alert, oriented  Skin: No acute rashes          Basic Metabolic Panel:   Recent Labs Lab 05/10/16 1240 05/11/16 0500 05/12/16 0500  NA 142 141 143  K 3.2* 3.0* 3.0*  CL 103 102 107  CO2 28 29 28   GLUCOSE 103* 107* 142*  BUN 87* 87* 89*  CREATININE  3.36* 3.29* 3.63*  CALCIUM 9.1 8.5* 8.5*  MG  --  2.1  --      CBC:  Recent Labs Lab 05/10/16 1240 05/11/16 0500  WBC 9.6 9.0  NEUTROABS 7.7*  --   HGB 9.4* 8.7*  HCT 27.7* 26.5*  MCV 92.2 95.4  PLT 327 315      Microbiology:  No results found for this or any previous visit (from the past 720 hour(s)).  Coagulation Studies: No results for input(s): LABPROT, INR in the last 72 hours.  Urinalysis: No results for input(s): COLORURINE, LABSPEC, PHURINE, GLUCOSEU, HGBUR, BILIRUBINUR, KETONESUR, PROTEINUR, UROBILINOGEN, NITRITE, LEUKOCYTESUR in the last 72 hours.  Invalid input(s): APPERANCEUR    Imaging: Dg Chest Port 1 View  Result Date: 05/12/2016 CLINICAL DATA:  Acute respiratory failure EXAM: PORTABLE CHEST 1 VIEW COMPARISON:  05/10/2016 FINDINGS: There is cardiomegaly. Mild vascular congestion and bilateral perihilar and lower lobe opacities, likely mild edema. Small bilateral effusions. IMPRESSION: Cardiomegaly. Suspect mild/early edema/ CHF. Small bilateral effusions. Electronically Signed   By: Charlett NoseKevin  Dover M.D.   On: 05/12/2016 15:04     Medications:    . amiodarone  150 mg Intravenous Once  . aspirin EC  81 mg Oral Daily  . [START ON 05/13/2016]  ceFAZolin (ANCEF) IV  1 g Intravenous Once  . heparin  5,000 Units Subcutaneous Q8H  . senna  1 tablet Oral Daily  . tuberculin  5 Units Intradermal Once   acetaminophen **OR** acetaminophen, alum & mag hydroxide-simeth, docusate sodium, ondansetron **OR** ondansetron (ZOFRAN) IV, senna-docusate  Assessment/ Plan:  82 y.o. caucasian female with hypertension, hyperlipidemia, anxiety, chronic kidney disease stage V  1.  Chronic kidney disease stage V, with uremic symptoms, likely progress to end-stage renal disease Outpatient, Dr. Wynelle LinkKolluru has recommended starting dialysis to the patient due to mild uremic symptoms in the form of continued weight loss,loss of taste.  Initially, patient was planning on doing home  peritoneal dialysis but due to recent elbow fracture, she will not be able to do that in the immediate future.   Initial plan was to proceed with PermCath placement tomorrow morning and then see if patient would be able to tolerate dialysis.  However, patient has become hemodynamically unstable this afternoon therefore we will have to determine suitability for PermCath placement in the morning. - Patient's family has requested palliative care.  Discussed with palliative care team  2. Anemia of chronic kidney disease. Hemoglobin 8.7  3. Hypokalemia Agree with holding furosemide for now  4.  Hypotension and arrythmias - correct electrolytes - close hemodynamic monitoring    LOS: 2 Ngoc Detjen 12/14/20175:14 PM

## 2016-05-12 NOTE — Evaluation (Signed)
Physical Therapy Evaluation Patient Details Name: Lacey Smith MRN: [id number] DOB: [birth year] Today's Date: 05/12/2016   History of Present Illness  82 year old female with recent L olecranon fx, now in splint and sling. Pt now admitted for acute kidney injury. Plan for perm cath placement next date in order to start HD. Signifcant hx includes anxiety, HTN, renal disorder, and neuropathy  Clinical Impression  Pt is a pleasant 82 year old female who was admitted for AKI. Pt recently admitted for L olecranon fx and is now re-admitted for AKI. Pt performs bed mobility/transfers with cga and refuses ambulation at this time despite encouragement. Pt very fearful of falling, despite good balance with SPC. Pt encouraged to perform OOB mobility with RN staff later this date to ambulate in room. Also [facility]. Pt reports son will come provide support in home post dc. Pt demonstrates deficits with strength/mobility/endurance. Would benefit from skilled PT to address above deficits and promote optimal return to PLOF. Recommend transition to HHPT upon discharge from acute hospitalization.       Follow Up Recommendations Home health PT;Supervision/Assistance - 24 hour    Equipment Recommendations  None recommended by PT    Recommendations for Other Services       Precautions / Restrictions Precautions Precautions: Fall Restrictions Weight Bearing Restrictions: Yes LUE Weight Bearing: Non weight bearing      Mobility  Bed Mobility Overal bed mobility: Needs Assistance Bed Mobility: Supine to Sit;Sit to Supine     Supine to sit: Min guard Sit to supine: Min guard   General bed mobility comments: safe technique demonstrated with transfer to R side of bed. Once seated, able to sit with supervision  Transfers Overall transfer level: Needs assistance Equipment used: Straight cane Transfers: Sit to/from Stand Sit to Stand: Min guard         General  transfer comment: slow technique performed with pt very anxious and nervous about mobility. Once standing, pt able to stand with supervision.   Ambulation/Gait             General Gait Details: refused ambulation secondry to anxiety. Heavy encouragement required for any OOB mobility. Pt is self limiting and capable of ambulation  Stairs            Wheelchair Mobility    Modified Rankin (Stroke Patients Only)       Balance Overall balance assessment: Needs assistance;History of Falls Sitting-balance support: Feet supported;No upper extremity supported Sitting balance-Leahy Scale: Good     Standing balance support: Single extremity supported Standing balance-Leahy Scale: Fair                               Pertinent Vitals/Pain Pain Assessment: No/denies pain    Home Living Family/patient expects to be discharged to:: Private residence Living Arrangements: Alone Available Help at Discharge: Family;Available 24 hours/day (son plans to stay with her if needed) Type of Home: House Home Access: Stairs to enter Entrance Stairs-Rails: Right Entrance Stairs-Number of Steps: 2 Home Layout: One level Home Equipment: Bedside commode;Other (comment);Walker - standard;Shower seat - built in;Hand held Engineer, civil (consulting)shower head;Cane - single point Additional Comments: Pt reports she will likely have someone come stay with her at her house at d/c rather than staying at a family member's house.    Prior Function Level of Independence: Independent with assistive device(s)         Comments: Pt reports  she has a Advertising copywriterhousekeeper and has been using SPC recently for mobility.     Hand Dominance   Dominant Hand: Right    Extremity/Trunk Assessment   Upper Extremity Assessment LUE Deficits / Details: in splint, NWB, olecranon fx    Lower Extremity Assessment Lower Extremity Assessment: Generalized weakness (grossly 4/5)       Communication   Communication: HOH  Cognition  Arousal/Alertness: Awake/alert Behavior During Therapy: Anxious Overall Cognitive Status: Within Functional Limits for tasks assessed                      General Comments      Exercises Other Exercises Other Exercises: Seated/supine therex performed including ankle pumps, LAQ, hip abd/add, and SLRs. All ther-ex peformed with supervsion x 10 reps    Assessment/Plan    PT Assessment Patient needs continued PT services  PT Problem List Decreased strength;Decreased range of motion;Decreased activity tolerance;Decreased balance;Decreased knowledge of use of DME;Decreased safety awareness;Pain          PT Treatment Interventions DME instruction;Gait training;Stair training;Functional mobility training;Therapeutic activities;Therapeutic exercise;Balance training;Patient/family education;Modalities    PT Goals (Current goals can be found in the Care Plan section)  Acute Rehab PT Goals Patient Stated Goal: to go home and be independent PT Goal Formulation: With patient Time For Goal Achievement: 05/26/16 Potential to Achieve Goals: Good    Frequency Min 2X/week   Barriers to discharge        Co-evaluation               End of Session Equipment Utilized During Treatment: Gait belt;Oxygen (L elbow sling) Activity Tolerance:  (limited by anxiety) Patient left: in bed;with bed alarm set Nurse Communication: Mobility status         Time: [id number] PT Time Calculation (min) (ACUTE ONLY): 26 min   Charges:   PT Evaluation $PT Eval Low Complexity: 1 Procedure PT Treatments $Therapeutic Exercise: 8-22 mins   PT G Codes:        Lacey Smith 05/12/2016, 11:43 AM  [name], PT, DPT [phone]

## 2016-05-12 NOTE — Progress Notes (Addendum)
Patient with lethargy and low BP high heart rates in 130s I think she needs to goto step down. D/w family and with Annabelle Harmanana, NP  HR 133 BP 96/60 lethargic LUNGS coarse b/l  CVS tachy irr ABD ND  EXT mild LEE  A/p Hypotension with lethargy and tachycardia  1. TELE MONITOR 2. tx to stepdown on pressors 3. Needs PERMCATH tomorrow if hemodynamic stable  CRITICAL TIME 45 minutes

## 2016-05-12 NOTE — Progress Notes (Signed)
Patient received in ICU 10 at approximately 1835. Patient transferred to ICU bed and placed on monitor- CCMD and EICU notified of patient's Arrival.  Vitals taken including weight. Patient given CHG bath, glucose 111, Foam placed on buttocks (Blancheable area noted at top of sacrum), Bruising note upper right arm, left hip, mild edema noted to bilateral ankles. Patient continued on 2 liters of nasal cannula as patient did not appear in any respiratory distress at this time. Patient received IV administration of Amiodarone Bolus, Administration rate and dose verified with Annabelle Harmanana, NP prior ro receipt, for Afib w/RVR. Patient's family in to see patient once received.  1920- Report was given to night nurse, Christi and care was transferred.

## 2016-05-12 NOTE — Progress Notes (Signed)
Bipap at bedside, not in use at this time.

## 2016-05-12 NOTE — Progress Notes (Signed)
Palliative Medicine Team has received consultation and spoken with Dr. Thedore MinsSingh. This NP will see in AM, 12/15 to discuss GOC with patient and family. Thank you for allowing the PMT to assist in the care of this patient.   NO CHARGE  Vennie HomansMegan Lino Wickliff, FNP-C Palliative Medicine Team  Phone: [phone] Fax: [phone]

## 2016-05-12 NOTE — Plan of Care (Signed)
Problem: Activity: Goal: Risk for activity intolerance will decrease Outcome: Not Progressing Patient is on bedrest.  Problem: Fluid Volume: Goal: Ability to maintain a balanced intake and output will improve Outcome: Not Progressing Patient is NPO.  Problem: Nutrition: Goal: Adequate nutrition will be maintained Outcome: Not Progressing Patient is NPO.

## 2016-05-12 NOTE — Progress Notes (Signed)
Daughter of patient called to say patient was upset and aggitated about being NPO.  Patient was thinking that the perm cath would be placed tomorrow.  Daughter requested that Dr Thedore MinsSingh call her.  I spoke with Dr Thedore MinsSingh.  He gave diet orders and said he would call daughter, Louanna RawGayle Fishel

## 2016-05-12 NOTE — Consult Note (Signed)
Name: Lacey Smith MRN: [id number] DOB: 16-Mar-1929    ADMISSION DATE:  05/10/2016 CONSULTATION DATE:  05/12/2016  REFERRING MD :  Dr. Juliene Pina  CHIEF COMPLAINT:  Worsening renal failure  BRIEF PATIENT DESCRIPTION: This is an 82 yo female who presented to [facility] ER 12/12 per Nephrology's recommendations for follow-up of a left elbow fracture and worsening renal failure.  SIGNIFICANT EVENTS  12/12-Admitted to [facility] medsurg unit due to worsening renal failure and left elbow fracture 12/14-Pt transferred to Stepdown Unit due to acute hypercapnic hypoxic respiratory failure secondary to pulmonary edema, confusion, hypotension, and new onset atrial fibrillaiton  STUDIES:  None  HISTORY OF PRESENT ILLNESS:   This is an 82 yo female with a PMH of Renal disorder, Neuropathy, Hypertriglyceridemia, HTN, and Anxiety.  She presented to [facility] ER on 12/12 per nephrologist recommendations due to worsening renal failure and follow-up of a left elbow fracture.  She recently came to the ER on 12/10 after a mechanical fall which resulted in a left elbow fracture and was referred to orthopedic surgery for follow-up.  During this ER visit her creatinine was increased to 3.3 her previous creatinine in Sep 2016 was 2.3, prompting this admission to the [facility] unit.  PCCM consulted 12/14 for further management of acute respiratory failure secondary to pulmonary edema requiring Bipap, confusion, hypotension likely secondary to antihypertensives and sedating medications, and new onset atrial fibrillation.   PAST MEDICAL HISTORY :   has a past medical history of Anxiety; Hypertension; Hypertriglyceridemia; Neuropathy (HCC); Psoriasis; and Renal disorder.  has a past surgical history that includes Joint replacement and Cataracts. Prior to Admission medications   Medication Sig Start Date End Date Taking? Authorizing Provider  acetaminophen (TYLENOL) 325 MG tablet Take 2 tablets (650 mg total) by mouth every 6 (six)  hours as needed for mild pain (or Fever >/= 101). 02/24/15  Yes Auburn Bilberry, MD  ALPRAZolam Prudy Feeler) 0.25 MG tablet Take 0.25 mg by mouth 2 (two) times daily as needed for anxiety.   Yes Historical Provider, MD  ALPRAZolam (XANAX) 0.25 MG tablet Take 1 tablet (0.25 mg total) by mouth 3 (three) times daily as needed for anxiety. 02/24/15  Yes Auburn Bilberry, MD  alum & mag hydroxide-simeth (MAALOX/MYLANTA) 200-200-20 MG/5ML suspension Take 30 mLs by mouth every 6 (six) hours as needed for indigestion or heartburn. 02/24/15  Yes Auburn Bilberry, MD  amLODipine (NORVASC) 10 MG tablet Take 0.5 tablets (5 mg total) by mouth daily. Patient taking differently: Take 10 mg by mouth daily.  02/24/15  Yes Auburn Bilberry, MD  aspirin EC 81 MG EC tablet Take 1 tablet (81 mg total) by mouth daily. 02/24/15  Yes Auburn Bilberry, MD  cloNIDine (CATAPRES) 0.2 MG tablet Take 0.5 tablets (0.1 mg total) by mouth 2 (two) times daily. Patient taking differently: Take 0.1 mg by mouth daily.  02/24/15  Yes Auburn Bilberry, MD  docusate sodium (COLACE) 100 MG capsule Take 2 capsules (200 mg total) by mouth 2 (two) times daily. 02/24/15  Yes Auburn Bilberry, MD  fenofibrate (TRICOR) 145 MG tablet Take 145 mg by mouth daily.   Yes Historical Provider, MD  furosemide (LASIX) 20 MG tablet Take 40 mg by mouth daily.   Yes Historical Provider, MD  lisinopril (PRINIVIL,ZESTRIL) 40 MG tablet Take 40 mg by mouth daily.   Yes Historical Provider, MD  metoprolol succinate (TOPROL-XL) 100 MG 24 hr tablet Take 100 mg by mouth daily. Take with or immediately following a meal.   Yes Historical Provider,  MD  ondansetron (ZOFRAN) 4 MG tablet Take 1 tablet (4 mg total) by mouth every 8 (eight) hours as needed for nausea or vomiting. 02/24/15  Yes Auburn BilberryShreyang Patel, MD  senna (SENOKOT) 8.6 MG TABS tablet Take 1 tablet (8.6 mg total) by mouth daily. 02/24/15  Yes Auburn BilberryShreyang Patel, MD  traMADol (ULTRAM) 50 MG tablet Take 1 tablet (50 mg total) by mouth every 6  (six) hours as needed for moderate pain. 02/24/15  Yes Auburn BilberryShreyang Patel, MD  oxyCODONE (OXY IR/ROXICODONE) 5 MG immediate release tablet Take 1 tablet (5 mg total) by mouth every 4 (four) hours as needed for moderate pain or severe pain. Patient not taking: Reported on 05/10/2016 02/24/15   Auburn BilberryShreyang Patel, MD   Allergies  Allergen Reactions  . Sulfa Antibiotics     FAMILY HISTORY:  family history includes Heart attack in her father; Heart failure in her mother; Rheumatic fever in her father. SOCIAL HISTORY:  reports that she quit smoking about 11 years ago. She quit after 55.00 years of use. She has never used smokeless tobacco. She reports that she does not drink alcohol or use drugs.  REVIEW OF SYSTEMS:  Positives in BOLD Constitutional: Negative for fever, chills, weight loss, malaise/fatigue and diaphoresis.  HENT: Negative for hearing loss, ear pain, nosebleeds, congestion, sore throat, neck pain, tinnitus and ear discharge.   Eyes: Negative for blurred vision, double vision, photophobia, pain, discharge and redness.  Respiratory: cough, hemoptysis, sputum production, shortness of breath, wheezing and stridor.   Cardiovascular: Negative for chest pain, palpitations, orthopnea, claudication, leg swelling and PND.  Gastrointestinal: Negative for heartburn, nausea, vomiting, abdominal pain, diarrhea, constipation, blood in stool and melena.  Genitourinary: Negative for dysuria, urgency, frequency, hematuria and flank pain.  Musculoskeletal: Negative for myalgias, back pain, joint pain and falls.  Skin: Negative for itching and rash.  Neurological: Negative for dizziness, tingling, tremors, sensory change, speech change, focal weakness, seizures, loss of consciousness, weakness and headaches.  Endo/Heme/Allergies: Negative for environmental allergies and polydipsia. Does not bruise/bleed easily.  SUBJECTIVE:  She currently states she is mildly short of breath and anxious  VITAL  SIGNS: Temp:  [98.4 F (36.9 C)-98.8 F (37.1 C)] 98.4 F (36.9 C) (12/14 1233) Pulse Rate:  [71-132] 132 (12/14 1345) Resp:  [17-19] 17 (12/14 1233) BP: (96-162)/(56-63) 96/60 (12/14 1345) SpO2:  [95 %-97 %] 97 % (12/14 1233)  PHYSICAL EXAMINATION: General:  Acutely ill appearing elderly Caucasian female Neuro:  Alert confused to time, follows commands, PERRLA HEENT: supple, no JVD Cardiovascular:  Irregular, irregular, no M/R/G Lungs:  Coarse and crackles throughout, mildly labored Abdomen:  +BS x4, soft, non tender, non distended Musculoskeletal:  Normal bulk and tone Skin:  Intact no rashes and lesions   Recent Labs Lab 05/10/16 1240 05/11/16 0500 05/12/16 0500  NA 142 141 143  K 3.2* 3.0* 3.0*  CL 103 102 107  CO2 28 29 28   BUN 87* 87* 89*  CREATININE 3.36* 3.29* 3.63*  GLUCOSE 103* 107* 142*    Recent Labs Lab 05/10/16 1240 05/11/16 0500  HGB 9.4* 8.7*  HCT 27.7* 26.5*  WBC 9.6 9.0  PLT 327 315   Dg Chest Port 1 View  Result Date: 05/12/2016 CLINICAL DATA:  Acute respiratory failure EXAM: PORTABLE CHEST 1 VIEW COMPARISON:  05/10/2016 FINDINGS: There is cardiomegaly. Mild vascular congestion and bilateral perihilar and lower lobe opacities, likely mild edema. Small bilateral effusions. IMPRESSION: Cardiomegaly. Suspect mild/early edema/ CHF. Small bilateral effusions. Electronically Signed   By: Caryn BeeKevin  Dover M.D.   On: 05/12/2016 15:04    ASSESSMENT / PLAN: Acute respiratory failure secondary to pulmonary edema Hypotension likely secondary to sedating medications and antihypertensives New onset atrial fibrillation CKD stage IV (worsening)  P: Continuous Bipap wean as tolerated Supplemental O2 to maintain O2 sats >92% Levophed gtt prn to maintain map >65 Amiodarone bolus once Echo pending Nephology consulted appreciate input Vascular Surgery consulted appreciate input-plans for Permcath Dialysis Catheter placement on 12/15 Per Nephrology once  dialysis catheter placed will start hemodialysis Repeat CXR in am  Prn bronchodilators   Sonda Rumbleana Dason Mosley, AGNP  Pulmonary/Critical Care Pager [phone] (please enter 7 digits) PCCM Consult Pager [phone] (please enter 7 digits)

## 2016-05-12 NOTE — [place] FL2 (Signed)
[place] MEDICAID FL2 LEVEL OF CARE SCREENING TOOL     IDENTIFICATION  Patient Name: Lacey Smith Birthdate: [birth year] Sex: female Admission Date (Current Location): 05/10/2016  [place]   Facility and Address:  [facility], [address], [place], [place] [address]      Provider Number: [id number]  Attending Physician Name and Address:  Adrian SaranSital Mody, MD  Relative Name and Phone Number:       Current Level of Care: Hospital Recommended Level of Care: Skilled Nursing Facility Prior Approval Number:    Date Approved/Denied:   PASRR Number: [id number]  Discharge Plan: SNF    Current Diagnoses: Patient Active Problem List   Diagnosis Date Noted  . Acute kidney injury (HCC) 05/10/2016  . Tibia fracture 02/19/2015    Orientation RESPIRATION BLADDER Height & Weight     Self, Situation, Place  Normal, O2 (2 liters O2) Continent Weight: 138 lb (62.6 kg) Height:  5\' 4"  (162.6 cm)  BEHAVIORAL SYMPTOMS/MOOD NEUROLOGICAL BOWEL NUTRITION STATUS   (none)  (none) Continent Diet (renal with fluid restriction)  AMBULATORY STATUS COMMUNICATION OF NEEDS Skin   Extensive Assist Verbally Normal                       Personal Care Assistance Level of Assistance  Dressing, Bathing Bathing Assistance: Maximum assistance   Dressing Assistance: Maximum assistance     Functional Limitations Info  Hearing   Hearing Info: Impaired      SPECIAL CARE FACTORS FREQUENCY  PT (By licensed PT)                    Contractures Contractures Info: Not present    Additional Factors Info  Code Status, Allergies Code Status Info: dnr Allergies Info: sulfa abx           Current Medications (05/12/2016):  This is the current hospital active medication list Current Facility-Administered Medications  Medication Dose Route Frequency Provider Last Rate Last Dose  . acetaminophen (TYLENOL) tablet 650 mg  650 mg Oral  Q6H PRN Adrian SaranSital Mody, MD       Or  . acetaminophen (TYLENOL) suppository 650 mg  650 mg Rectal Q6H PRN Adrian SaranSital Mody, MD      . alum & mag hydroxide-simeth (MAALOX/MYLANTA) 200-200-20 MG/5ML suspension 30 mL  30 mL Oral Q6H PRN Adrian SaranSital Mody, MD      . aspirin EC tablet 81 mg  81 mg Oral Daily Sital Mody, MD   81 mg at 05/12/16 1045  . docusate sodium (COLACE) capsule 200 mg  200 mg Oral BID PRN Adrian SaranSital Mody, MD   200 mg at 05/12/16 1045  . heparin injection 5,000 Units  5,000 Units Subcutaneous Q8H Adrian SaranSital Mody, MD   5,000 Units at 05/12/16 0515  . irbesartan (AVAPRO) tablet 75 mg  75 mg Oral QHS Mosetta PigeonHarmeet Singh, MD   75 mg at 05/11/16 2118  . metoprolol succinate (TOPROL-XL) 24 hr tablet 100 mg  100 mg Oral Daily Sital Mody, MD   100 mg at 05/12/16 1045  . ondansetron (ZOFRAN) tablet 4 mg  4 mg Oral Q6H PRN Adrian SaranSital Mody, MD       Or  . ondansetron (ZOFRAN) injection 4 mg  4 mg Intravenous Q6H PRN Adrian SaranSital Mody, MD      . senna (SENOKOT) tablet 8.6 mg  1 tablet Oral Daily Sital Mody, MD   8.6 mg at 05/12/16 1045  . senna-docusate (Senokot-S)  tablet 1 tablet  1 tablet Oral QHS PRN Adrian SaranSital Mody, MD   1 tablet at 05/10/16 2031  . tuberculin injection 5 Units  5 Units Intradermal Once Mosetta PigeonHarmeet Singh, MD   5 Units at 05/11/16 1750     Discharge Medications: Please see discharge summary for a list of discharge medications.  Relevant Imaging Results:  Relevant Lab Results:   Additional Information ss: [id number]  York SpanielMonica Carey Lafon, LCSW

## 2016-05-12 NOTE — Progress Notes (Signed)
I have talked with ICU about when I will be able to transport the patient to ICU.  No time given yet

## 2016-05-12 NOTE — Progress Notes (Signed)
Patient transferred to ICU with oxygen by me

## 2016-05-12 NOTE — Care Management (Signed)
Per nephrology patient will require outpatient HD.  I have faxed initial HD information to Susann Givensheryl Brawner HD liaison.

## 2016-05-12 NOTE — Clinical Social Work Note (Signed)
Clinical Social Work Assessment  Patient Details  Name: Lacey Smith MRN: [id number] Date of Birth: 1929/03/07  Date of referral:  05/12/16               Reason for consult:  Facility Placement                Permission sought to share information with:  Family Supports Permission granted to share information::  Yes, Verbal Permission Granted  Name::        Agency::     Relationship::     Contact Information:     Housing/Transportation Living arrangements for the past 2 months:  Single Family Home Source of Information:  Patient, Adult Children Patient Interpreter Needed:  None Criminal Activity/Legal Involvement Pertinent to Current Situation/Hospitalization:  No - Comment as needed Significant Relationships:  Adult Children Lives with:  Self Do you feel safe going back to the place where you live?  Yes Need for family participation in patient care:  Yes (Comment)  Care giving concerns:  Patient lives alone and PT has assessed and recommended home health and 24 hour supervision.   Social Worker assessment / plan:  CSW asked by physician to see if patient would be eligible for rehab as patient's family was here and they are wanting rehab for her. CSW initially visited with patient alone. Patient is still competent to make her own decisions and so CSW visited with her and explained role of CSW and purpose of visit. Patient stated that she has experienced too much too soon and that she only came in with a fractured elbow and is now having to decide to being dialysis/outpatient dialysis and consider if she is strong enough to return home at discharge. CSW validated her feelings and informed her that she needs to take this one day at a time and wait to see how she does with initiating dialysis. CSW explained that PT will continue to see her while she is here in the hospital. Patient gave me permission to speak with her children who were waiting in the waiting room.   CSW met with  patient's son: Lacey Smith ([phone]) and with her daughter: Lacey Smith ([phone]). CSW provided supportive counseling and encouraged them to take plans slowly with patient as she is currently very overwhelmed. CSW discussed rehab placement and qualifications and stated that PT would continue to work with her and make further recommendations. Patient's family verbalized understanding and agreement.   Employment status:  Retired Forensic scientist:  Medicare PT Recommendations:  [address] [place] / Referral to community resources:     Patient/Family's Response to care:  Patient and family expressed appreciation for CSW visit.  Patient/Family's Understanding of and Emotional Response to Diagnosis, Current Treatment, and Prognosis:  Patient is very overwhelmed and CSW assisted with processing with patient. In addition, patient's daughter and son are concerned that patient will not be able to return home.  Emotional Assessment Appearance:  Appears stated age Attitude/Demeanor/Rapport:   (pleasant and cooperative) Affect (typically observed):  Overwhelmed Orientation:  Oriented to Self, Oriented to Place, Oriented to Situation Alcohol / Substance use:  Not Applicable Psych involvement (Current and /or in the community):  No (Comment)  Discharge Needs  Concerns to be addressed:  Care Coordination Readmission within the last 30 days:  No Current discharge risk:  None Barriers to Discharge:  No Barriers Identified   Shela Leff, LCSW 05/12/2016, 2:26 PM

## 2016-05-12 NOTE — Progress Notes (Signed)
Family is concerned about the patient being lethargic.  Pt says she does feel lethargic.  Heart rate 132 BP 96/60. Dr Juliene PinaMody notified.  Orders received for normal saline bolus,  Stop BP meds, telemetry

## 2016-05-13 DIAGNOSIS — E8770 Fluid overload, unspecified: Secondary | ICD-10-CM

## 2016-05-13 DIAGNOSIS — Z515 Encounter for palliative care: Secondary | ICD-10-CM

## 2016-05-13 DIAGNOSIS — J9622 Acute and chronic respiratory failure with hypercapnia: Secondary | ICD-10-CM

## 2016-05-13 DIAGNOSIS — N179 Acute kidney failure, unspecified: Secondary | ICD-10-CM

## 2016-05-13 DIAGNOSIS — Z7189 Other specified counseling: Secondary | ICD-10-CM

## 2016-05-13 DIAGNOSIS — J810 Acute pulmonary edema: Secondary | ICD-10-CM

## 2016-05-13 DIAGNOSIS — F411 Generalized anxiety disorder: Secondary | ICD-10-CM

## 2016-05-13 DIAGNOSIS — J9621 Acute and chronic respiratory failure with hypoxia: Secondary | ICD-10-CM

## 2016-05-13 LAB — BASIC METABOLIC PANEL
ANION GAP: 11 (ref 5–15)
BUN: 98 mg/dL — ABNORMAL HIGH (ref 6–20)
CALCIUM: 8.6 mg/dL — AB (ref 8.9–10.3)
CHLORIDE: 107 mmol/L (ref 101–111)
CO2: 26 mmol/L (ref 22–32)
Creatinine, Ser: 4.07 mg/dL — ABNORMAL HIGH (ref 0.44–1.00)
GFR calc non Af Amer: 9 mL/min — ABNORMAL LOW (ref >60)
GFR, EST AFRICAN AMERICAN: 11 mL/min — AB (ref >60)
GLUCOSE: 123 mg/dL — AB (ref 65–99)
Potassium: 3.9 mmol/L (ref 3.5–5.1)
Sodium: 144 mmol/L (ref 135–145)

## 2016-05-13 LAB — BLOOD GAS, ARTERIAL
Acid-Base Excess: 1 mmol/L (ref 0.0–2.0)
Acid-base deficit: 1.6 mmol/L (ref 0.0–2.0)
Bicarbonate: 28.9 mmol/L — ABNORMAL HIGH (ref 20.0–28.0)
Bicarbonate: 27.8 mmol/L (ref 20.0–28.0)
FIO2: 28
FIO2: 28
O2 Saturation: 88.1 %
O2 Saturation: 92.4 %
PATIENT TEMPERATURE: 37
PCO2 ART: 66 mmHg — AB (ref 32.0–48.0)
PO2 ART: 64 mmHg — AB (ref 83.0–108.0)
Patient temperature: 37
pCO2 arterial: 78 mmHg (ref 32.0–48.0)
pH, Arterial: 7.25 — ABNORMAL LOW (ref 7.350–7.450)
pH, Arterial: 7.16 — CL (ref 7.350–7.450)
pO2, Arterial: 82 mmHg — ABNORMAL LOW (ref 83.0–108.0)

## 2016-05-13 LAB — HEPATITIS B SURFACE ANTIGEN: Hepatitis B Surface Ag: NEGATIVE

## 2016-05-13 LAB — HEPATITIS B SURFACE ANTIBODY,QUALITATIVE: HEP B S AB: NONREACTIVE

## 2016-05-13 LAB — HEPATITIS B CORE ANTIBODY, IGM: Hep B C IgM: NEGATIVE

## 2016-05-13 LAB — ECHOCARDIOGRAM COMPLETE
HEIGHTINCHES: 64 in
Weight: 2208 oz

## 2016-05-13 MED ORDER — ORAL CARE MOUTH RINSE
15.0000 mL | Freq: Two times a day (BID) | OROMUCOSAL | Status: DC
  Administered 2016-05-14 (×2): 15 mL via OROMUCOSAL

## 2016-05-13 MED ORDER — CHLORHEXIDINE GLUCONATE 0.12 % MT SOLN
15.0000 mL | Freq: Two times a day (BID) | OROMUCOSAL | Status: DC
  Administered 2016-05-13 – 2016-05-14 (×2): 15 mL via OROMUCOSAL
  Filled 2016-05-13: qty 15

## 2016-05-13 MED ORDER — ALPRAZOLAM 0.25 MG PO TABS: 0.2500 mg | ORAL_TABLET | Freq: Two times a day (BID) | ORAL | Status: DC | PRN

## 2016-05-13 NOTE — Progress Notes (Signed)
Name: Lacey DanceRebecca Burgener MRN: [id number] DOB: 1928-08-31    ADMISSION DATE:  05/10/2016 CONSULTATION DATE:  05/12/2016    BRIEF PATIENT DESCRIPTION: This is an 82 yo female who [facility] per Nephrology's recommendations for follow-up of a left elbow fracture and worsening renal failure.  SIGNIFICANT EVENTS  12/12-Ad[facility] med[facility] due to worsening renal failure and left elbow fracture 12/14-[facility] due to acute hypercapnic hypoxic respiratory failure secondary to pulmonary edema, confusion, hypotension, and new onset atrial fibrillaiton  STUDIES:  None  Subjective;  Patient is lethargic this morning, she did not wear BiPAP   Prior to Admission medications   Medication Sig Start Date End Date Taking? Authorizing Provider  acetaminophen (TYLENOL) 325 MG tablet Take 2 tablets (650 mg total) by mouth every 6 (six) hours as needed for mild pain (or Fever >/= 101). 02/24/15  Yes Auburn BilberryShreyang Patel, MD  ALPRAZolam Prudy Feeler(XANAX) 0.25 MG tablet Take 0.25 mg by mouth 2 (two) times daily as needed for anxiety.   Yes Historical Provider, MD  ALPRAZolam (XANAX) 0.25 MG tablet Take 1 tablet (0.25 mg total) by mouth 3 (three) times daily as needed for anxiety. 02/24/15  Yes Auburn BilberryShreyang Patel, MD  alum & mag hydroxide-simeth (MAALOX/MYLANTA) 200-200-20 MG/5ML suspension Take 30 mLs by mouth every 6 (six) hours as needed for indigestion or heartburn. 02/24/15  Yes Auburn BilberryShreyang Patel, MD  amLODipine (NORVASC) 10 MG tablet Take 0.5 tablets (5 mg total) by mouth daily. Patient taking differently: Take 10 mg by mouth daily.  02/24/15  Yes Auburn BilberryShreyang Patel, MD  aspirin EC 81 MG EC tablet Take 1 tablet (81 mg total) by mouth daily. 02/24/15  Yes Auburn BilberryShreyang Patel, MD  cloNIDine (CATAPRES) 0.2 MG tablet Take 0.5 tablets (0.1 mg total) by mouth 2 (two) times daily. Patient taking differently: Take 0.1 mg by mouth daily.  02/24/15  Yes Auburn BilberryShreyang Patel, MD  docusate sodium (COLACE) 100  MG capsule Take 2 capsules (200 mg total) by mouth 2 (two) times daily. 02/24/15  Yes Auburn BilberryShreyang Patel, MD  fenofibrate (TRICOR) 145 MG tablet Take 145 mg by mouth daily.   Yes Historical Provider, MD  furosemide (LASIX) 20 MG tablet Take 40 mg by mouth daily.   Yes Historical Provider, MD  lisinopril (PRINIVIL,ZESTRIL) 40 MG tablet Take 40 mg by mouth daily.   Yes Historical Provider, MD  metoprolol succinate (TOPROL-XL) 100 MG 24 hr tablet Take 100 mg by mouth daily. Take with or immediately following a meal.   Yes Historical Provider, MD  ondansetron (ZOFRAN) 4 MG tablet Take 1 tablet (4 mg total) by mouth every 8 (eight) hours as needed for nausea or vomiting. 02/24/15  Yes Auburn BilberryShreyang Patel, MD  senna (SENOKOT) 8.6 MG TABS tablet Take 1 tablet (8.6 mg total) by mouth daily. 02/24/15  Yes Auburn BilberryShreyang Patel, MD  traMADol (ULTRAM) 50 MG tablet Take 1 tablet (50 mg total) by mouth every 6 (six) hours as needed for moderate pain. 02/24/15  Yes Auburn BilberryShreyang Patel, MD  oxyCODONE (OXY IR/ROXICODONE) 5 MG immediate release tablet Take 1 tablet (5 mg total) by mouth every 4 (four) hours as needed for moderate pain or severe pain. Patient not taking: Reported on 05/10/2016 02/24/15   Auburn BilberryShreyang Patel, MD   Allergies  Allergen Reactions  . Sulfa Antibiotics    REVIEW OF SYSTEMS:   Could not obtain due to lethargy.   VITAL SIGNS: Temp:  [98.2 F (36.8 C)-98.4 F (36.9 C)] 98.2 F (36.8 C) (  12/15 0300) Pulse Rate:  [60-132] 61 (12/15 0500) Resp:  [14-25] 21 (12/15 0300) BP: (86-146)/(46-100) 128/58 (12/15 0500) SpO2:  [91 %-100 %] 100 % (12/15 0500)  PHYSICAL EXAMINATION: General:  Acutely ill appearing elderly Caucasian female Neuro:  Alert confused to time, follows commands, PERRLA HEENT: supple, no JVD Cardiovascular:  Irregular, irregular, no M/R/G Lungs:  Coarse and crackles throughout, mildly labored Abdomen:  +BS x4, soft, non tender, non distended Musculoskeletal:  Normal bulk and tone Skin:  Intact  no rashes and lesions   Recent Labs Lab 05/11/16 0500 05/12/16 0500 05/13/16 0551  NA 141 143 144  K 3.0* 3.0* 3.9  CL 102 107 107  CO2 29 28 26   BUN 87* 89* 98*  CREATININE 3.29* 3.63* 4.07*  GLUCOSE 107* 142* 123*    Recent Labs Lab 05/10/16 1240 05/11/16 0500  HGB 9.4* 8.7*  HCT 27.7* 26.5*  WBC 9.6 9.0  PLT 327 315   Dg Chest Port 1 View  Result Date: 05/12/2016 CLINICAL DATA:  Acute respiratory failure EXAM: PORTABLE CHEST 1 VIEW COMPARISON:  05/10/2016 FINDINGS: There is cardiomegaly. Mild vascular congestion and bilateral perihilar and lower lobe opacities, likely mild edema. Small bilateral effusions. IMPRESSION: Cardiomegaly. Suspect mild/early edema/ CHF. Small bilateral effusions. Electronically Signed   By: Charlett NoseKevin  Dover M.D.   On: 05/12/2016 15:04    ASSESSMENT / PLAN: Acute respiratory failure secondary to pulmonary edema and AECOPD with hypoventilation.  ABG 12/15 am: 7.16/78/82/27.8; consistent with acute hypercapnic respiratory failure. AKI on CKD.  New onset atrial fibrillation CKD stage IV (worsening)  Echo 05/12/16; EF 35% P: Continuous Bipap at night and prn during day.  Amiodarone  Nephology consulted appreciate input Vascular Surgery consulted appreciate input-plans for Permcath Dialysis Catheter placement on 12/15   -Deep Nicholos Johnsamachandran, M.D. 05/13/2016  Critical Care Attestation.  I have personally obtained a history, examined the patient, evaluated laboratory and imaging results, formulated the assessment and plan and placed orders. The Patient requires high complexity decision making for assessment and support, frequent evaluation and titration of therapies, application of advanced monitoring technologies and extensive interpretation of multiple databases. The patient has critical illness that could lead imminently to failure of 1 or more organ systems and requires the highest level of physician preparedness to intervene.  Critical Care Time  devoted to patient care services described in this note is 45 minutes and is exclusive of time spent in procedures.

## 2016-05-13 NOTE — Consult Note (Signed)
Consultation Note Date: 05/13/2016   Patient Name: Lacey Smith  DOB: 10-27-1928  MRN: [id number]  Age / Sex: 82 y.o., female  PCP: Kasandra Knudsen, NP Referring Physician: Bettey Costa, MD  Reason for Consultation: Establishing goals of care  HPI/Patient Profile: 82 y.o. female  with past medical history of chronic kidney disease stage IV, neuropathy, hypertension, and anxiety admitted on 05/10/2016 from nephrologist with increasing creatinine. Patient was seen on 12/10 after mechanical fall. Patient has a left closed olecranon fracture. This is being treated conservatively with splint and sling. Patient also reports weight loss and food having a metallic taste. Outpatient, Dr. Juleen China has recommended starting dialysis due to mild uremic symptoms. She was planning on peritoneal dialysis but due to recent elbow fracture, she will need hemodialysis instead. On 12/14, patient became lethargic with low BP and HR in 130's. Transferred to stepdown. Now in acute hypoxic and hypercarbic respiratory failure due to acute systolic heart failure requiring BiPAP. Family requesting palliative medicine consultation for goals of care.   Clinical Assessment and Goals of Care: Met with patient, daughter Edd Fabian), and son Richardson Landry) this afternoon. Introduced palliative medicine. Up until this hospitalization, Ms. Roemer has been living independently in a home. She is a widow and has three children. Her husband was in the [employer] so they travelled throughout the [place]. She worked as a Scientist, research (medical) and enjoyed fashion.   Initially met with Edd Fabian and Richardson Landry privately. They speak of their mother's declining health in the past year, with worsening weakness and falls. She is a very stubborn lady and has always enjoyed her independence. They tell me she has been seeing nephrology for about a year due to chronic kidney disease. The family  was to attend classes on dialysis this week and she was planning to start peritoneal at home by January. Edd Fabian and Richardson Landry feel their mother has not always been truthful with them about the severity of her kidney condition. Now that she has declined, they feel that she has an unrealistic expectation of dialysis and thinks that she will be able to return home, live independently once her arm is healed, and to start peritoneal dialysis. It is not realistic for Edd Fabian and Richardson Landry to be with their mother 24/7 at home and feel it is necessary she lives in a nursing home from here. They are concerned that their mother is not making the right decision.  I explained that she is of sound mind and still able to make her own decisions, therefore we have to respect her wishes. They understand and will respect whatever decision she makes.   In the room, patient, children, and I discussed the options. I painted the picture for Ms. Moxey of what dialysis would look like on a weekly basis and that it will give her quantity of life but may not improve her quality of life. Discussed the new onset of heart failure and respiratory failure in the setting of ESRD and that her heart many not be able to tolerate dialysis. Also mentioned  that she will most likely need to be on cardiac medications for this heart failure. We told Ms. Whiteside that if stable enough to leave the hospital, she will have to go to a nursing facility for rehab and may even become long term. She was absolutely shocked to hear this and states "I would rather die then live in a nursing home." She also did not know of the severity of her heart.   Ms. Coen asked how much time she would have if she did not start dialysis. I told her time would be short, most likely weeks (if not days) due to her worsening kidney functions each day. Explained the focus would be on symptom management and comfort, quality, and dignity for however long she may have left. (I had privately  discussed with Edd Fabian and [facility] services if she chose not to start dialysis). She asked if she could consider starting dialysis to see how she does and consider going to a nursing home, but if she "hated it" could she stop dialysis? I reassured her that this is her decision to make and we will follow her wishes in either scenario.   Ms. Spake asks for more time to think this decision through tonight. She remains shocked that this has happened so fast and seems fearful knowing if she doesn't start dialysis, she will die. She states "I came to the hospital for a broken elbow and now all of this. What a way to die." She wants to be alone at this time.   I answered all questions for the family and offered my support. Hard choices copy given.     SUMMARY OF RECOMMENDATIONS    DNR/DNI  Patient and family wanting more time to consider options on whether to start dialysis or not. From our conversation, she will most likely start dialysis.  PMT not at [facility] over the weekend but will f/u 12/18 if still hospitalized.   Code Status/Advance Care Planning:  DNR   Symptom Management:   Xanax 0.38m BID prn for anxiety. (Home dose. Family requesting she receive a dose after our conversation).   Palliative Prophylaxis:   Aspiration, Delirium Protocol, Frequent Pain Assessment, Oral Care and Turn Reposition  Additional Recommendations (Limitations, Scope, Preferences):  Full Scope Treatment -except DNR/DNI  Psycho-social/Spiritual:   Desire for further Chaplaincy support:yes  Additional Recommendations: Caregiving  Support/Resources  Prognosis:   Unable to determine  Discharge Planning: To Be Determined      Primary Diagnoses: Present on Admission: . Acute kidney injury ([place]   I have reviewed the medical record, interviewed the patient and family, and examined the patient. The following aspects are pertinent.  Past Medical History:  Diagnosis Date  . Anxiety   .  Hypertension   . Hypertriglyceridemia   . Neuropathy ([place]   . Psoriasis   . Renal disorder    Social History   Social History  . Marital status: Widowed    Spouse name: N/A  . Number of children: N/A  . Years of education: N/A   Social History Main Topics  . Smoking status: Former Smoker    Years: 55.00    Quit date: 02/18/2005  . Smokeless tobacco: Never Used  . Alcohol use No  . Drug use: No  . Sexual activity: Not Asked   Other Topics Concern  . None   Social History Narrative  . None   Family History  Problem Relation Age of Onset  . Heart failure Mother   .  Heart attack Father   . Rheumatic fever Father    Scheduled Meds: . aspirin EC  81 mg Oral Daily  . heparin  5,000 Units Subcutaneous Q8H  . senna  1 tablet Oral Daily  . tuberculin  5 Units Intradermal Once   Continuous Infusions: . amiodarone 30 mg/hr (05/13/16 0250)   PRN Meds:.acetaminophen **OR** acetaminophen, alum & mag hydroxide-simeth, docusate sodium, ondansetron **OR** ondansetron (ZOFRAN) IV, senna-docusate Medications Prior to Admission:  Prior to Admission medications   Medication Sig Start Date End Date Taking? Authorizing Provider  acetaminophen (TYLENOL) 325 MG tablet Take 2 tablets (650 mg total) by mouth every 6 (six) hours as needed for mild pain (or Fever >/= 101). 02/24/15  Yes Dustin Flock, MD  ALPRAZolam Duanne Moron) 0.25 MG tablet Take 0.25 mg by mouth 2 (two) times daily as needed for anxiety.   Yes Historical Provider, MD  ALPRAZolam (XANAX) 0.25 MG tablet Take 1 tablet (0.25 mg total) by mouth 3 (three) times daily as needed for anxiety. 02/24/15  Yes Dustin Flock, MD  alum & mag hydroxide-simeth (MAALOX/MYLANTA) 200-200-20 MG/5ML suspension Take 30 mLs by mouth every 6 (six) hours as needed for indigestion or heartburn. 02/24/15  Yes Dustin Flock, MD  amLODipine (NORVASC) 10 MG tablet Take 0.5 tablets (5 mg total) by mouth daily. Patient taking differently: Take 10 mg by mouth  daily.  02/24/15  Yes Dustin Flock, MD  aspirin EC 81 MG EC tablet Take 1 tablet (81 mg total) by mouth daily. 02/24/15  Yes Dustin Flock, MD  cloNIDine (CATAPRES) 0.2 MG tablet Take 0.5 tablets (0.1 mg total) by mouth 2 (two) times daily. Patient taking differently: Take 0.1 mg by mouth daily.  02/24/15  Yes Dustin Flock, MD  docusate sodium (COLACE) 100 MG capsule Take 2 capsules (200 mg total) by mouth 2 (two) times daily. 02/24/15  Yes Dustin Flock, MD  fenofibrate (TRICOR) 145 MG tablet Take 145 mg by mouth daily.   Yes Historical Provider, MD  furosemide (LASIX) 20 MG tablet Take 40 mg by mouth daily.   Yes Historical Provider, MD  lisinopril (PRINIVIL,ZESTRIL) 40 MG tablet Take 40 mg by mouth daily.   Yes Historical Provider, MD  metoprolol succinate (TOPROL-XL) 100 MG 24 hr tablet Take 100 mg by mouth daily. Take with or immediately following a meal.   Yes Historical Provider, MD  ondansetron (ZOFRAN) 4 MG tablet Take 1 tablet (4 mg total) by mouth every 8 (eight) hours as needed for nausea or vomiting. 02/24/15  Yes Dustin Flock, MD  senna (SENOKOT) 8.6 MG TABS tablet Take 1 tablet (8.6 mg total) by mouth daily. 02/24/15  Yes Dustin Flock, MD  traMADol (ULTRAM) 50 MG tablet Take 1 tablet (50 mg total) by mouth every 6 (six) hours as needed for moderate pain. 02/24/15  Yes Dustin Flock, MD  oxyCODONE (OXY IR/ROXICODONE) 5 MG immediate release tablet Take 1 tablet (5 mg total) by mouth every 4 (four) hours as needed for moderate pain or severe pain. Patient not taking: Reported on 05/10/2016 02/24/15   Dustin Flock, MD   Allergies  Allergen Reactions  . Sulfa Antibiotics    Review of Systems  Unable to perform ROS: Acuity of condition   Physical Exam  Constitutional: She is oriented to person, place, and time. She is cooperative. She appears ill.  Cardiovascular: Regular rhythm and normal heart sounds.   Pulmonary/Chest: She has decreased breath sounds.  Dyspnea at rest    Abdominal: Soft. Bowel sounds are normal.  Neurological: She is alert and oriented to person, place, and time.  Skin: Skin is warm and dry.  Psychiatric: Her speech is normal and behavior is normal. Her mood appears anxious. Cognition and memory are normal.  Nursing note and vitals reviewed.  Vital Signs: BP (!) 121/56   Pulse 65   Temp 98.4 F (36.9 C) (Axillary)   Resp 16   Ht 5' 4"  (1.626 m)   Wt 62.6 kg (138 lb)   SpO2 99%   BMI 23.69 kg/m  Pain Assessment: No/denies pain   Pain Score: 0-No pain  SpO2: SpO2: 99 % O2 Device:SpO2: 99 % O2 Flow Rate: .O2 Flow Rate (L/min): 2 L/min  IO: Intake/output summary:   Intake/Output Summary (Last 24 hours) at 05/13/16 1239 Last data filed at 05/13/16 1200  Gross per 24 hour  Intake                0 ml  Output              250 ml  Net             -250 ml   LBM: Last BM Date: 05/08/16 Baseline Weight: Weight: 62.6 kg (138 lb) Most recent weight: Weight: 62.6 kg (138 lb)     Palliative Assessment/Data: PPS 30%   Flowsheet Rows   Flowsheet Row Most Recent Value  Intake Tab  Referral Department  Nephrology  Unit at Time of Referral  Med/Surg Unit  Palliative Care Primary Diagnosis  Nephrology  Date Notified  05/12/16  Palliative Care Type  New Palliative care  Reason for referral  Clarify Goals of Care  Date of Admission  05/10/16  # of days IP prior to Palliative referral  2  Clinical Assessment  Palliative Performance Scale Score  30%  Psychosocial & Spiritual Assessment  Palliative Care Outcomes  Patient/Family meeting held?  Yes  Who was at the meeting?  patient, son, and daughter  Palliative Care Outcomes  Clarified goals of care, Provided psychosocial or spiritual support, Provided advance care planning      Time In: 1400 Time Out: 1530 Time Total: 52mn Greater than 50%  of this time was spent counseling and coordinating care related to the above assessment and plan.  Signed by:  MIhor Dow  FNP-C Palliative Medicine Team  Phone: [phone]: [phone]  Please contact Palliative Medicine Team phone at [phone] questions and concerns.  For individual provider: See AShea Evans

## 2016-05-13 NOTE — Care Management (Signed)
Patient transferred to icu 12/14 afternoon due to respiratory distress requiring bipap and new onset atrial fib.  She is to have perm cath placed today

## 2016-05-13 NOTE — Progress Notes (Signed)
Subjective:   Patient is known to our practice from outpatient and is followed for chronic kidney disease She presented for evaluation after a fall and is found to have elbow fracture Currently being managed nonsurgically Patient also reports that she has been losing weight, food doesn't taste right to her- has a metallic taste She was planning on home dialysis- PD but due to arm fracture, she will not be able to do it  Patient became critically Daily yesterday with atrial fibrillation and shortness of breath.  She had to be placed on BiPAP this morning.  She converted back to normal sinus rhythm this morning  Objective:  Vital signs in last 24 hours:  Temp:  [98.2 F (36.8 C)-98.4 F (36.9 C)] 98.4 F (36.9 C) (12/15 0800) Pulse Rate:  [60-122] 65 (12/15 1200) Resp:  [14-32] 16 (12/15 1200) BP: (86-164)/(46-100) 121/56 (12/15 1200) SpO2:  [88 %-100 %] 99 % (12/15 1200)  Weight change:  Filed Weights   05/10/16 1122  Weight: 62.6 kg (138 lb)    Intake/Output:    Intake/Output Summary (Last 24 hours) at 05/13/16 1430 Last data filed at 05/13/16 1200  Gross per 24 hour  Intake                0 ml  Output              250 ml  Net             -250 ml     Physical Exam: General: No acute distress, elderly lady, laying in the bed  HEENT Anicteric moist mucous membranes  Neck Supple,  Pulm/lungs BiPAP, bilateral crackles  CVS/Heart No rub or gallop, tachycardic, irregular  Abdomen:  Soft, nontender  Extremities: Left arm and elbow are in splint, trace peripheral edema  Neurologic: lethargic  Skin: No acute rashes          Basic Metabolic Panel:   Recent Labs Lab 05/10/16 1240 05/11/16 0500 05/12/16 0500 05/13/16 0551  NA 142 141 143 144  K 3.2* 3.0* 3.0* 3.9  CL 103 102 107 107  CO2 28 29 28 26   GLUCOSE 103* 107* 142* 123*  BUN 87* 87* 89* 98*  CREATININE 3.36* 3.29* 3.63* 4.07*  CALCIUM 9.1 8.5* 8.5* 8.6*  MG  --  2.1  --   --      CBC:  Recent  Labs Lab 05/10/16 1240 05/11/16 0500  WBC 9.6 9.0  NEUTROABS 7.7*  --   HGB 9.4* 8.7*  HCT 27.7* 26.5*  MCV 92.2 95.4  PLT 327 315      Microbiology:  Recent Results (from the past 720 hour(s))  MRSA PCR Screening     Status: None   Collection Time: 05/12/16  6:50 PM  Result Value Ref Range Status   MRSA by PCR NEGATIVE NEGATIVE Final    Comment:        The GeneXpert MRSA Assay (FDA approved for NASAL specimens only), is one component of a comprehensive MRSA colonization surveillance program. It is not intended to diagnose MRSA infection nor to guide or monitor treatment for MRSA infections.     Coagulation Studies: No results for input(s): LABPROT, INR in the last 72 hours.  Urinalysis: No results for input(s): COLORURINE, LABSPEC, PHURINE, GLUCOSEU, HGBUR, BILIRUBINUR, KETONESUR, PROTEINUR, UROBILINOGEN, NITRITE, LEUKOCYTESUR in the last 72 hours.  Invalid input(s): APPERANCEUR    Imaging: Dg Chest Port 1 View  Result Date: 05/12/2016 CLINICAL DATA:  Acute respiratory failure EXAM: PORTABLE  CHEST 1 VIEW COMPARISON:  05/10/2016 FINDINGS: There is cardiomegaly. Mild vascular congestion and bilateral perihilar and lower lobe opacities, likely mild edema. Small bilateral effusions. IMPRESSION: Cardiomegaly. Suspect mild/early edema/ CHF. Small bilateral effusions. Electronically Signed   By: Charlett NoseKevin  Dover M.D.   On: 05/12/2016 15:04     Medications:   . amiodarone 30 mg/hr (05/13/16 0250)   . aspirin EC  81 mg Oral Daily  . chlorhexidine  15 mL Mouth Rinse BID  . heparin  5,000 Units Subcutaneous Q8H  . mouth rinse  15 mL Mouth Rinse q12n4p  . senna  1 tablet Oral Daily  . tuberculin  5 Units Intradermal Once   acetaminophen **OR** acetaminophen, alum & mag hydroxide-simeth, docusate sodium, ondansetron **OR** ondansetron (ZOFRAN) IV, senna-docusate  Assessment/ Plan:  82 y.o. caucasian female with hypertension, hyperlipidemia, anxiety, chronic kidney  disease stage V  1.  Chronic kidney disease stage V, with uremic symptoms, likely progress to end-stage renal disease Outpatient, Dr. Wynelle LinkKolluru has recommended starting dialysis to the patient due to mild uremic symptoms in the form of continued weight loss,loss of taste.  Initially, patient was planning on doing home peritoneal dialysis but due to recent elbow fracture, she will not be able to do that in the immediate future.   Initial plan was to proceed with PermCath placement today and then see if patient would be able to tolerate dialysis.  However, patient became hemodynamically unstable.  Due to frailty and advanced age, patient will have difficult time with dialysis. Chronic dialysis will affect her quality of life significantly. She will likely not be able to be independent like before with dialysis 3 times per week. She is too weak to do PD and now has arm fracture - Palliative care meeting today to discuss goals of care  2. Anemia of chronic kidney disease. Hemoglobin 8.7  3. Hypokalemia Replace prn  4.  Hypotension and arrythmias - correct electrolytes - close hemodynamic monitoring  5. Acute pulmonary edema and hypercapneia - requiring BiPAP    LOS: 3 Sylena Lotter 12/15/20172:30 PM

## 2016-05-13 NOTE — Progress Notes (Signed)
[employer] - [place] at [facility]   PATIENT NAME: Lacey DanceRebecca Smith    MR#:  [id number]  DATE OF BIRTH:  05-01-1929  SUBJECTIVE:   Due to tachycardia and low blood pressure patient was sent to the stepdown unit. She has not been started on pressors. She was started on amiodarone for her tachycardia and new onset atrial fibrillation Heart rate-controlled She is currently on BiPAP for acute respiratory failure  REVIEW OF SYSTEMS:    ROS  Patient on BIpap sleeping   Tolerating Diet:yes      DRUG ALLERGIES:   Allergies  Allergen Reactions  . Sulfa Antibiotics     VITALS:  Blood pressure (!) 128/58, pulse 61, temperature 98.2 F (36.8 C), temperature source Oral, resp. rate (!) 21, height 5\' 4"  (1.626 m), weight 62.6 kg (138 lb), SpO2 100 %.  PHYSICAL EXAMINATION:   Physical Exam  Constitutional: She is well-developed, well-nourished, and in no distress. No distress.  Wearing Bipap not distressed  HENT:  Head: Normocephalic.  Eyes: No scleral icterus.  Neck: Neck supple. No JVD present. No tracheal deviation present.  Cardiovascular: Normal heart sounds.  Exam reveals no gallop and no friction rub.   No murmur heard. Irr, irr  Pulmonary/Chest: Effort normal. No respiratory distress. She has wheezes. She has no rales. She exhibits no tenderness.  Abdominal: Soft. Bowel sounds are normal. She exhibits no distension and no mass. There is no tenderness. There is no rebound and no guarding.  Musculoskeletal: She exhibits edema.  Left arm in splint  Neurological:  Sleeping arousable but does not want to engage in conversation  Skin: Skin is warm. No rash noted. No erythema.  Psychiatric: Affect normal.      LABORATORY PANEL:   CBC  Recent Labs Lab 05/11/16 0500  WBC 9.0  HGB 8.7*  HCT 26.5*  PLT 315   ------------------------------------------------------------------------------------------------------------------  Chemistries   Recent  Labs Lab 05/10/16 1240 05/11/16 0500  05/13/16 0551  NA 142 141  < > 144  K 3.2* 3.0*  < > 3.9  CL 103 102  < > 107  CO2 28 29  < > 26  GLUCOSE 103* 107*  < > 123*  BUN 87* 87*  < > 98*  CREATININE 3.36* 3.29*  < > 4.07*  CALCIUM 9.1 8.5*  < > 8.6*  MG  --  2.1  --   --   AST 60*  --   --   --   ALT 27  --   --   --   ALKPHOS 55  --   --   --   BILITOT 0.7  --   --   --   < > = values in this interval not displayed. ------------------------------------------------------------------------------------------------------------------  Cardiac Enzymes No results for input(s): TROPONINI in the last 168 hours. ------------------------------------------------------------------------------------------------------------------  RADIOLOGY:  Dg Chest Port 1 View  Result Date: 05/12/2016 CLINICAL DATA:  Acute respiratory failure EXAM: PORTABLE CHEST 1 VIEW COMPARISON:  05/10/2016 FINDINGS: There is cardiomegaly. Mild vascular congestion and bilateral perihilar and lower lobe opacities, likely mild edema. Small bilateral effusions. IMPRESSION: Cardiomegaly. Suspect mild/early edema/ CHF. Small bilateral effusions. Electronically Signed   By: Charlett NoseKevin  Dover M.D.   On: 05/12/2016 15:04     ASSESSMENT AND PLAN:    82 year old female with chronic kidney disease stage V who presented per nephrology recommendation is for worsening creatinine and is now found to have acute hypoxic respiratory failure with new-onset atrial fibrillation and hypotension.  1. Acute hypoxic/hypercarbic respiratory failure due to acute systolic and diastolic heart failure with chronic kidney disease stage V/progression to ESRD: Continue BiPAP which is assisting with diuresis Family to decide on dialysis Family meeting today with Palliative Care   2. Hypotension: This was from tachycardia This is improved since heart rate is improved  3. New onset atrial fibrillation on amiodarone drip: Continue with plans to change  to oral once patient is more awake and alert  4. New acute combined heart failure with ejection fraction of 35-40%: Patient will eventually need beta blocker and ACE inhibitor however due to blood pressure and kidney issues cannot start occasions at this time.  5. Left closed Olecron fx: Patient had decided on conservative therapy Continue with sling this week and plan on discontinuing sling with gentle elbow range of motion exercises next week if patient able to.     Palliative care consult today.  CODE STATUS: DNR  Critically ill TOTAL TIME TAKING CARE OF THIS PATIENT: 33 minutes.   High risk of cardiac/resp arrest  POSSIBLE D/C ??, DEPENDING ON CLINICAL CONDITION.   Lacey Smith M.D on 05/13/2016 at 11:23 AM  Between 7am to 6pm - Pager - [phone] After 6pm go to [email or web address] - password [facility] [email or web address]  [facility] [place] [facility]  Office  [phone]  CC: Primary care physician; Imelda PillowHOLLAND, CHELSA, NP  Note: This dictation was prepared with Dragon dictation along with smaller phrase technology. Any transcriptional errors that result from this process are unintentional.

## 2016-05-13 NOTE — Progress Notes (Signed)
Palliative meeting today at 2pm to discuss goals of care with patient and family.  NO CHARGE   Vennie HomansMegan Tal Neer, FNP-C Palliative Medicine Team  Phone: [phone] Fax: [phone]

## 2016-05-14 ENCOUNTER — Inpatient Hospital Stay: Payer: Medicare Other

## 2016-05-14 DIAGNOSIS — I482 Chronic atrial fibrillation: Secondary | ICD-10-CM

## 2016-05-14 LAB — BASIC METABOLIC PANEL
ANION GAP: 13 (ref 5–15)
BUN: 108 mg/dL — ABNORMAL HIGH (ref 6–20)
CALCIUM: 8.4 mg/dL — AB (ref 8.9–10.3)
CO2: 24 mmol/L (ref 22–32)
CREATININE: 4.97 mg/dL — AB (ref 0.44–1.00)
Chloride: 106 mmol/L (ref 101–111)
GFR calc Af Amer: 8 mL/min — ABNORMAL LOW (ref >60)
GFR, EST NON AFRICAN AMERICAN: 7 mL/min — AB (ref >60)
GLUCOSE: 141 mg/dL — AB (ref 65–99)
Potassium: 3.9 mmol/L (ref 3.5–5.1)
Sodium: 143 mmol/L (ref 135–145)

## 2016-05-14 LAB — CBC
HCT: 26.6 % — ABNORMAL LOW (ref 35.0–47.0)
Hemoglobin: 8.5 g/dL — ABNORMAL LOW (ref 12.0–16.0)
MCH: 30.5 pg (ref 26.0–34.0)
MCHC: 32.1 g/dL (ref 32.0–36.0)
MCV: 94.8 fL (ref 80.0–100.0)
PLATELETS: 309 10*3/uL (ref 150–440)
RBC: 2.8 MIL/uL — ABNORMAL LOW (ref 3.80–5.20)
RDW: 16.3 % — AB (ref 11.5–14.5)
WBC: 11 10*3/uL (ref 3.6–11.0)

## 2016-05-14 LAB — BLOOD GAS, ARTERIAL
Acid-base deficit: 1.3 mmol/L (ref 0.0–2.0)
Bicarbonate: 26.6 mmol/L (ref 20.0–28.0)
FIO2: 0.28
O2 Saturation: 93.4 %
Patient temperature: 37
pCO2 arterial: 62 mmHg — ABNORMAL HIGH (ref 32.0–48.0)
pH, Arterial: 7.24 — ABNORMAL LOW (ref 7.350–7.450)
pO2, Arterial: 80 mmHg — ABNORMAL LOW (ref 83.0–108.0)

## 2016-05-14 MED ORDER — HALOPERIDOL LACTATE 2 MG/ML PO CONC
0.5000 mg | ORAL | Status: DC | PRN
  Filled 2016-05-14: qty 0.3

## 2016-05-14 MED ORDER — AMIODARONE HCL 200 MG PO TABS
200.0000 mg | ORAL_TABLET | Freq: Two times a day (BID) | ORAL | Status: DC
  Administered 2016-05-14: 200 mg via ORAL
  Filled 2016-05-14: qty 1

## 2016-05-14 MED ORDER — LORAZEPAM 2 MG/ML IJ SOLN: 1.0000 mg | INTRAMUSCULAR | Status: DC | PRN

## 2016-05-14 MED ORDER — LORAZEPAM 2 MG/ML IJ SOLN
INTRAMUSCULAR | Status: AC
  Administered 2016-05-14: 2 mg
  Filled 2016-05-14: qty 1

## 2016-05-14 MED ORDER — MORPHINE SULFATE 10 MG/5ML PO SOLN: 5.0000 mg | ORAL | Status: DC | PRN

## 2016-05-14 MED ORDER — LORAZEPAM 2 MG/ML IJ SOLN: 2.0000 mg | Freq: Once | INTRAMUSCULAR | Status: DC

## 2016-05-14 MED ORDER — LORAZEPAM 1 MG PO TABS: 1.0000 mg | ORAL_TABLET | ORAL | Status: DC | PRN

## 2016-05-14 MED ORDER — MORPHINE SULFATE (PF) 4 MG/ML IV SOLN: 1.0000 mg | INTRAVENOUS | Status: DC | PRN

## 2016-05-14 MED ORDER — SODIUM CHLORIDE 0.9% FLUSH: 3.0000 mL | INTRAVENOUS | Status: DC | PRN

## 2016-05-14 MED ORDER — HALOPERIDOL LACTATE 5 MG/ML IJ SOLN: 0.5000 mg | INTRAMUSCULAR | Status: DC | PRN

## 2016-05-14 MED ORDER — MORPHINE SULFATE (PF) 4 MG/ML IV SOLN
1.0000 mg | INTRAVENOUS | Status: DC | PRN
Start: 2016-05-14 — End: 2016-05-15
  Administered 2016-05-14 – 2016-05-15 (×4): 2 mg via INTRAVENOUS
  Filled 2016-05-14 (×5): qty 1

## 2016-05-14 MED ORDER — LORAZEPAM 2 MG/ML PO CONC: 1.0000 mg | ORAL | Status: DC | PRN

## 2016-05-14 MED ORDER — HALOPERIDOL 0.5 MG PO TABS: 0.5000 mg | ORAL_TABLET | ORAL | Status: DC | PRN

## 2016-05-14 MED ORDER — SODIUM CHLORIDE 0.9 % IV SOLN: 250.0000 mL | INTRAVENOUS | Status: DC | PRN

## 2016-05-14 MED ORDER — ACETAMINOPHEN 325 MG PO TABS: 650.0000 mg | ORAL_TABLET | Freq: Four times a day (QID) | ORAL | Status: DC | PRN

## 2016-05-14 MED ORDER — ACETAMINOPHEN 650 MG RE SUPP: 650.0000 mg | Freq: Four times a day (QID) | RECTAL | Status: DC | PRN

## 2016-05-14 MED ORDER — SODIUM CHLORIDE 0.9% FLUSH: 3.0000 mL | Freq: Two times a day (BID) | INTRAVENOUS | Status: DC

## 2016-05-14 MED ORDER — AMIODARONE HCL IN DEXTROSE 360-4.14 MG/200ML-% IV SOLN: 30.0000 mg/h | INTRAVENOUS | Status: DC

## 2016-05-14 NOTE — Progress Notes (Signed)
Lacey Smith and Lacey Smith notified of their mother being moved to room 215.

## 2016-05-14 NOTE — Progress Notes (Signed)
Family discussion with Patient's daughter Dondra SpryGail [facility]CPOA), her brothers and sister in law  We discussed that pursuing aggressive care such as dialysis is not consistent with patient's wishes of independent living and NEVER going to a nursing home. She will not be able to live independently at home with her current health problems and dialysis.  They have had detailed discussion with palliative care team last couple of days and feel that patient would not want to pursue dialysis if that does not lead to independent life with dignity.   Therefore, they are requesting Hospice services, possibly Hospice home.   I have contacted the case manager on call and also notified Dr Auburn BilberryShreyang Patel of the family's wishes.

## 2016-05-14 NOTE — Progress Notes (Signed)
[employer] - [place] at [facility]   PATIENT NAME: Lacey DanceRebecca Mcglown    MR#:  [id number]  DATE OF BIRTH:  1929/02/22  SUBJECTIVE:  Pt feeling better, Currently off BiPAP Still undecided about hemodialysis  REVIEW OF SYSTEMS:    ROS   CONSTITUTIONAL: No documented fever. No fatigue, weakness. No weight gain, no weight loss.  EYES: No blurry or double vision.  ENT: No tinnitus. No postnasal drip. No redness of the oropharynx.  RESPIRATORY: No cough, no wheeze, no hemoptysis. No dyspnea.  CARDIOVASCULAR: No chest pain. No orthopnea. No palpitations. No syncope.  GASTROINTESTINAL: No nausea, no vomiting or diarrhea. No abdominal pain. No melena or hematochezia.  GENITOURINARY:  No urgency. No frequency. No dysuria. No hematuria. No obstructive symptoms. No discharge. No pain. No significant abnormal bleeding ENDOCRINE: No polyuria or nocturia. No heat or cold intolerance.  HEMATOLOGY: No anemia. No bruising. No bleeding. No purpura. No petechiae INTEGUMENTARY: No rashes. No lesions.  MUSCULOSKELETAL: No arthritis. No swelling. No gout.  NEUROLOGIC: No numbness, tingling, or ataxia. No seizure-type activity.  PSYCHIATRIC: No anxiety. No insomnia. No ADD.           DRUG ALLERGIES:   Allergies  Allergen Reactions  . Sulfa Antibiotics     VITALS:  Blood pressure (!) 128/44, pulse 70, temperature 98.4 F (36.9 C), temperature source Oral, resp. rate (!) 25, height 5\' 4"  (1.626 m), weight 138 lb (62.6 kg), SpO2 100 %.  PHYSICAL EXAMINATION:   Physical Exam  Constitutional: She is well-developed, well-nourished, and in no distress. No distress.  Wearing Bipap not distressed  HENT:  Head: Normocephalic.  Eyes: No scleral icterus.  Neck: Neck supple. No JVD present. No tracheal deviation present.  Cardiovascular: Normal heart sounds.  Exam reveals no gallop and no friction rub.   No murmur heard. Irr, irr  Pulmonary/Chest: Effort normal. No respiratory  distress. She has no wheezes. She has no rales. She exhibits no tenderness.  Abdominal: Soft. Bowel sounds are normal. She exhibits no distension and no mass. There is no tenderness. There is no rebound and no guarding.  Musculoskeletal: She exhibits edema.  Left arm in splint  Neurological:  Sleeping arousable but does not want to engage in conversation  Skin: Skin is warm. No rash noted. No erythema.  Psychiatric: Affect normal.      LABORATORY PANEL:   CBC  Recent Labs Lab 05/14/16 0439  WBC 11.0  HGB 8.5*  HCT 26.6*  PLT 309   ------------------------------------------------------------------------------------------------------------------  Chemistries   Recent Labs Lab 05/10/16 1240 05/11/16 0500  05/14/16 0439  NA 142 141  < > 143  K 3.2* 3.0*  < > 3.9  CL 103 102  < > 106  CO2 28 29  < > 24  GLUCOSE 103* 107*  < > 141*  BUN 87* 87*  < > 108*  CREATININE 3.36* 3.29*  < > 4.97*  CALCIUM 9.1 8.5*  < > 8.4*  MG  --  2.1  --   --   AST 60*  --   --   --   ALT 27  --   --   --   ALKPHOS 55  --   --   --   BILITOT 0.7  --   --   --   < > = values in this interval not displayed. ------------------------------------------------------------------------------------------------------------------  Cardiac Enzymes No results for input(s): TROPONINI in the last 168 hours. ------------------------------------------------------------------------------------------------------------------  RADIOLOGY:  Dg Chest 1 View  Result Date: 05/14/2016 CLINICAL DATA:  82 year old female with shortness of breath. EXAM: CHEST 1 VIEW COMPARISON:  Chest radiograph dated 05/12/2016 FINDINGS: Small bilateral pleural effusions with bilateral mid to lower lung field hazy densities compatible with atelectasis versus infiltrate. There is no pneumothorax. There is stable cardiomegaly with mild vascular congestion. The bones are osteopenic. No acute fracture. IMPRESSION: Cardiomegaly with mild  congestive changes. Small bilateral pleural effusions with bilateral mid to lower lung field atelectasis versus infiltrate. Overall slight increase in the size of the pleural effusion and bibasilar hazy densities compared to the prior radiograph. Follow-up recommended. Electronically Signed   By: Elgie CollardArash  Radparvar M.D.   On: 05/14/2016 07:04   Dg Chest Port 1 View  Result Date: 05/12/2016 CLINICAL DATA:  Acute respiratory failure EXAM: PORTABLE CHEST 1 VIEW COMPARISON:  05/10/2016 FINDINGS: There is cardiomegaly. Mild vascular congestion and bilateral perihilar and lower lobe opacities, likely mild edema. Small bilateral effusions. IMPRESSION: Cardiomegaly. Suspect mild/early edema/ CHF. Small bilateral effusions. Electronically Signed   By: Charlett NoseKevin  Dover M.D.   On: 05/12/2016 15:04     ASSESSMENT AND PLAN:    82 year old female with chronic kidney disease stage V who presented per nephrology recommendation is for worsening creatinine and is now found to have acute hypoxic respiratory failure with new-onset atrial fibrillation and hypotension.  1. Acute hypoxic/hypercarbic respiratory failure due to acute systolic and diastolic heart failure with chronic kidney disease stage V/progression to ESRD: Continue BiPAPAt nighttime Still awaiting decision with family and patient regarding dialysis   2. Hypotension: This was from tachycardia His resolved with blood pressure currently stable  3. New onset atrial fibrillation continue oral amiodarone heart rate stable   4. New acute combined heart failure with ejection fraction of 35-40%:  Currently not on an ACE inhibitor due to acute renal failure   5. Left closed Olecron fx: Patient had decided on conservative therapy Continue with sling this week and plan on discontinuing sling with gentle elbow range of motion exercises next week if patient able to.    CODE STATUS: DNR   TOTAL TIME TAKING CARE OF THIS PATIENT: 33 minutes.   High risk of  cardiac/resp arrest     Auburn BilberryPATEL, Allyn Bertoni M.D on 05/14/2016 at 2:05 PM  Between 7am to 6pm - Pager - [phone] After 6pm go to [email or web address] - password [employer] [facility] [email or web address]  [facility] [place] [facility]  Office  [phone]  CC: Primary care physician; Imelda PillowHOLLAND, CHELSA, NP  Note: This dictation was prepared with Dragon dictation along with smaller phrase technology. Any transcriptional errors that result from this process are unintentional.

## 2016-05-14 NOTE — Progress Notes (Signed)
Pt is requesting food and said "I am about to have a meltdown" Pt requested time to decide whether to undergo dialysis or not. Pt verbalizes understanding that refusing dialysis will be life threatening. Pt and RN discussed if she decides to eat tonight then she will not be able to have dialysis catheter placed but will have to wait another day to be NPO after midnight. Pt verbalizes understanding. RN called daughter Inocencio HomesGayle to verfiy understanding. Inocencio HomesGayle spoke with pt. To again verify understanding. Pt explained she already decided against dialysis.

## 2016-05-14 NOTE — Progress Notes (Signed)
Case d/w son, as per earlier conversation, it is confirmed patient family wanting comfort care measure, he stated we can start the process now I will transfer patient to there floor with comfort caare measures.

## 2016-05-14 NOTE — Progress Notes (Signed)
Subjective:   Patient is known to our practice from outpatient and is followed for chronic kidney disease. She presented for evaluation after a fall and is found to have elbow fracture. Currently being managed nonsurgically  Patient also reports that she has been losing weight, food doesn't taste right to her- has a metallic taste She was planning on home dialysis( PD) but due to arm fracture, she will not be able to do it  Patient became critically ill with atrial fibrillation and shortness of breath.  She had to be placed on BiPAP and transferred to ICU for monitoring.  She converted back to normal sinus rhythm  Breathing is better She is struggling with the decision whether to go forward with with dialysis or not BUN/Cr are critically high Cr 4.97/BUN 108  Objective:  Vital signs in last 24 hours:  Temp:  [97.6 F (36.4 C)-98.2 F (36.8 C)] 98.2 F (36.8 C) (12/16 0100) Pulse Rate:  [59-131] 78 (12/16 0700) Resp:  [13-32] 21 (12/16 0700) BP: (115-164)/(43-77) 138/51 (12/16 0700) SpO2:  [88 %-100 %] 100 % (12/16 0700)  Weight change:  Filed Weights   05/10/16 1122  Weight: 62.6 kg (138 lb)    Intake/Output:    Intake/Output Summary (Last 24 hours) at 05/14/16 1010 Last data filed at 05/13/16 1200  Gross per 24 hour  Intake                0 ml  Output              150 ml  Net             -150 ml     Physical Exam: General: No acute distress, elderly lady, laying in the bed  HEENT Anicteric moist mucous membranes  Neck Supple,  Pulm/lungs bilateral crackles, [place] oxygen  CVS/Heart No rub or gallop, tachycardic, sinus with irregular rhythm at times  Abdomen:  Soft, nontender  Extremities: Left arm and elbow are in splint, trace peripheral edema  Neurologic: Alert, oreinted  Skin: Warm dry          Basic Metabolic Panel:   Recent Labs Lab 05/10/16 1240 05/11/16 0500 05/12/16 0500 05/13/16 0551 05/14/16 0439  NA 142 141 143 144 143  K 3.2* 3.0* 3.0* 3.9  3.9  CL 103 102 107 107 106  CO2 28 29 28 26 24   GLUCOSE 103* 107* 142* 123* 141*  BUN 87* 87* 89* 98* 108*  CREATININE 3.36* 3.29* 3.63* 4.07* 4.97*  CALCIUM 9.1 8.5* 8.5* 8.6* 8.4*  MG  --  2.1  --   --   --      CBC:  Recent Labs Lab 05/10/16 1240 05/11/16 0500 05/14/16 0439  WBC 9.6 9.0 11.0  NEUTROABS 7.7*  --   --   HGB 9.4* 8.7* 8.5*  HCT 27.7* 26.5* 26.6*  MCV 92.2 95.4 94.8  PLT 327 315 309      Microbiology:  Recent Results (from the past 720 hour(s))  MRSA PCR Screening     Status: None   Collection Time: 05/12/16  6:50 PM  Result Value Ref Range Status   MRSA by PCR NEGATIVE NEGATIVE Final    Comment:        The GeneXpert MRSA Assay (FDA approved for NASAL specimens only), is one component of a comprehensive MRSA colonization surveillance program. It is not intended to diagnose MRSA infection nor to guide or monitor treatment for MRSA infections.     Coagulation Studies: No  results for input(s): LABPROT, INR in the last 72 hours.  Urinalysis: No results for input(s): COLORURINE, LABSPEC, PHURINE, GLUCOSEU, HGBUR, BILIRUBINUR, KETONESUR, PROTEINUR, UROBILINOGEN, NITRITE, LEUKOCYTESUR in the last 72 hours.  Invalid input(s): APPERANCEUR    Imaging: Dg Chest 1 View  Result Date: 05/14/2016 CLINICAL DATA:  82 year old female with shortness of breath. EXAM: CHEST 1 VIEW COMPARISON:  Chest radiograph dated 05/12/2016 FINDINGS: Small bilateral pleural effusions with bilateral mid to lower lung field hazy densities compatible with atelectasis versus infiltrate. There is no pneumothorax. There is stable cardiomegaly with mild vascular congestion. The bones are osteopenic. No acute fracture. IMPRESSION: Cardiomegaly with mild congestive changes. Small bilateral pleural effusions with bilateral mid to lower lung field atelectasis versus infiltrate. Overall slight increase in the size of the pleural effusion and bibasilar hazy densities compared to the  prior radiograph. Follow-up recommended. Electronically Signed   By: Anner Crete M.D.   On: 05/14/2016 07:04   Dg Chest Port 1 View  Result Date: 05/12/2016 CLINICAL DATA:  Acute respiratory failure EXAM: PORTABLE CHEST 1 VIEW COMPARISON:  05/10/2016 FINDINGS: There is cardiomegaly. Mild vascular congestion and bilateral perihilar and lower lobe opacities, likely mild edema. Small bilateral effusions. IMPRESSION: Cardiomegaly. Suspect mild/early edema/ CHF. Small bilateral effusions. Electronically Signed   By: Rolm Baptise M.D.   On: 05/12/2016 15:04     Medications:    . amiodarone  200 mg Oral BID  . aspirin EC  81 mg Oral Daily  . chlorhexidine  15 mL Mouth Rinse BID  . heparin  5,000 Units Subcutaneous Q8H  . mouth rinse  15 mL Mouth Rinse q12n4p  . senna  1 tablet Oral Daily   acetaminophen **OR** acetaminophen, ALPRAZolam, alum & mag hydroxide-simeth, docusate sodium, morphine injection, ondansetron **OR** ondansetron (ZOFRAN) IV, senna-docusate  Assessment/ Plan:  82 y.o. caucasian female with hypertension, hyperlipidemia, anxiety, chronic kidney disease stage V  1.  Chronic kidney disease stage V, with uremic symptoms, likely progress to end-stage renal disease Outpatient, Dr. Juleen China has recommended starting dialysis to the patient due to mild uremic symptoms in the form of continued weight loss,loss of taste.  Initially, patient was planning on doing home peritoneal dialysis but due to recent elbow fracture, she will not be able to do that in the immediate future.    Due to frailty and advanced age, patient will have difficult time with dialysis. Chronic dialysis will affect her quality of life significantly. She will likely not be able to be independent like before with dialysis 3 times per week. She is too weak to do PD and now has arm fracture. She still has to undergo several procedures for access and it could be a burden to go to the dialysis center three times per  week - Palliative care has met with the patient and her family  Our options are as follows  i) proceed with HD with temporary dialysis cathter and see how she tolerates it.   ii) Forego dialysis and consider hospice care Patient wants to talk to her children and let us know about their decision this afternoon  2. Anemia of chronic kidney disease. Hemoglobin 8.5  3. Hypokalemia Replace prn  4.  Hypotension and arrythmias - correct electrolytes - close hemodynamic monitoring  5. Acute pulmonary edema and hypercapneia - requiring BiPAP- Patient refusing placement of biPAP     LOS: 4 Analea Muller 12/16/201710:10 AM

## 2016-05-14 NOTE — Care Management Note (Signed)
Case Management Note  Patient Details  Name: Lacey DanceRebecca Smith MRN: [id number] Date of Birth: Jan 29, 1929  Subjective/Objective:    Dr Thedore MinsSingh called to report that the family is now requesting Hospice Facility. Paperwork faxed to [facility] [place] Carlis StableCaswell Debbie at Fax: [phone]. [facility] after she has a chance to review the paperwork. If all goes well [facility]                 Action/Plan:   Expected Discharge Date:                  Expected Discharge Plan:     In-House Referral:     Discharge planning Services     Post Acute Care Choice:    Choice offered to:     DME Arranged:    DME Agency:     HH Arranged:    HH Agency:     Status of Service:     If discussed at [employer] Length of Stay Meetings, dates discussed:    Additional Comments:  Jewelle Whitner A, RN 05/14/2016, 4:58 PM

## 2016-05-14 NOTE — Progress Notes (Signed)
Pt's heart rate noted to increase into the 160's, rhythm of a-fib with RVR. Dr. Allena KatzPatel notified of same and stated to re-start the amio drip at a rate of 30 mg/hr. Pt's family is in the process of determining whether they want pt to be placed on comfort care at this time. Will continue to monitor and treat as appropriate.

## 2016-05-14 NOTE — Progress Notes (Signed)
Name: Lacey DanceRebecca Dorfman MRN: [id number] DOB: [birth year]    ADMISSION DATE:  05/10/2016 CONSULTATION DATE:  05/12/2016    BRIEF PATIENT DESCRIPTION: This is an 82 yo female who presented to [facility] ER 12/12 per Nephrology's recommendations for follow-up of a left elbow fracture and worsening renal failure.  SIGNIFICANT EVENTS  12/12-[facility] due to worsening renal failure and left elbow fracture 12/14-[facility] due to acute hypercapnic hypoxic respiratory failure secondary to pulmonary edema, confusion, hypotension, and new onset atrial fibrillaiton  STUDIES:  None  Subjective;  Patient is awake and alert this am.    Prior to Admission medications   Medication Sig Start Date End Date Taking? Authorizing Provider  acetaminophen (TYLENOL) 325 MG tablet Take 2 tablets (650 mg total) by mouth every 6 (six) hours as needed for mild pain (or Fever >/= 101). 02/24/15  Yes Auburn BilberryShreyang Patel, MD  ALPRAZolam Prudy Feeler(XANAX) 0.25 MG tablet Take 0.25 mg by mouth 2 (two) times daily as needed for anxiety.   Yes Historical Provider, MD  ALPRAZolam (XANAX) 0.25 MG tablet Take 1 tablet (0.25 mg total) by mouth 3 (three) times daily as needed for anxiety. 02/24/15  Yes Auburn BilberryShreyang Patel, MD  alum & mag hydroxide-simeth (MAALOX/MYLANTA) 200-200-20 MG/5ML suspension Take 30 mLs by mouth every 6 (six) hours as needed for indigestion or heartburn. 02/24/15  Yes Auburn BilberryShreyang Patel, MD  amLODipine (NORVASC) 10 MG tablet Take 0.5 tablets (5 mg total) by mouth daily. Patient taking differently: Take 10 mg by mouth daily.  02/24/15  Yes Auburn BilberryShreyang Patel, MD  aspirin EC 81 MG EC tablet Take 1 tablet (81 mg total) by mouth daily. 02/24/15  Yes Auburn BilberryShreyang Patel, MD  cloNIDine (CATAPRES) 0.2 MG tablet Take 0.5 tablets (0.1 mg total) by mouth 2 (two) times daily. Patient taking differently: Take 0.1 mg by mouth daily.  02/24/15  Yes Auburn BilberryShreyang Patel, MD  docusate sodium (COLACE) 100 MG capsule Take 2  capsules (200 mg total) by mouth 2 (two) times daily. 02/24/15  Yes Auburn BilberryShreyang Patel, MD  fenofibrate (TRICOR) 145 MG tablet Take 145 mg by mouth daily.   Yes Historical Provider, MD  furosemide (LASIX) 20 MG tablet Take 40 mg by mouth daily.   Yes Historical Provider, MD  lisinopril (PRINIVIL,ZESTRIL) 40 MG tablet Take 40 mg by mouth daily.   Yes Historical Provider, MD  metoprolol succinate (TOPROL-XL) 100 MG 24 hr tablet Take 100 mg by mouth daily. Take with or immediately following a meal.   Yes Historical Provider, MD  ondansetron (ZOFRAN) 4 MG tablet Take 1 tablet (4 mg total) by mouth every 8 (eight) hours as needed for nausea or vomiting. 02/24/15  Yes Auburn BilberryShreyang Patel, MD  senna (SENOKOT) 8.6 MG TABS tablet Take 1 tablet (8.6 mg total) by mouth daily. 02/24/15  Yes Auburn BilberryShreyang Patel, MD  traMADol (ULTRAM) 50 MG tablet Take 1 tablet (50 mg total) by mouth every 6 (six) hours as needed for moderate pain. 02/24/15  Yes Auburn BilberryShreyang Patel, MD  oxyCODONE (OXY IR/ROXICODONE) 5 MG immediate release tablet Take 1 tablet (5 mg total) by mouth every 4 (four) hours as needed for moderate pain or severe pain. Patient not taking: Reported on 05/10/2016 02/24/15   Auburn BilberryShreyang Patel, MD   Allergies  Allergen Reactions  . Sulfa Antibiotics    REVIEW OF SYSTEMS:   Could not obtain due to lethargy.   VITAL SIGNS: Temp:  [97.6 F (36.4 C)-98.2 F (36.8 C)] 98.2 F (36.8 C) (12/16 0100)  Pulse Rate:  [59-131] 78 (12/16 0700) Resp:  [13-32] 21 (12/16 0700) BP: (115-164)/(43-77) 138/51 (12/16 0700) SpO2:  [88 %-100 %] 100 % (12/16 0700)  PHYSICAL EXAMINATION: General:  Acutely ill appearing elderly Caucasian female Neuro:  Alert confused to time, follows commands, PERRLA HEENT: supple, no JVD Cardiovascular:  Irregular, irregular, no M/R/G Lungs:  Coarse and crackles throughout, mildly labored Abdomen:  +BS x4, soft, non tender, non distended Musculoskeletal:  Normal bulk and tone Skin:  Intact no rashes and  lesions   Recent Labs Lab 05/12/16 0500 05/13/16 0551 05/14/16 0439  NA 143 144 143  K 3.0* 3.9 3.9  CL 107 107 106  CO2 28 26 24   BUN 89* 98* 108*  CREATININE 3.63* 4.07* 4.97*  GLUCOSE 142* 123* 141*    Recent Labs Lab 05/10/16 1240 05/11/16 0500 05/14/16 0439  HGB 9.4* 8.7* 8.5*  HCT 27.7* 26.5* 26.6*  WBC 9.6 9.0 11.0  PLT 327 315 309   Dg Chest 1 View  Result Date: 05/14/2016 CLINICAL DATA:  82 year old female with shortness of breath. EXAM: CHEST 1 VIEW COMPARISON:  Chest radiograph dated 05/12/2016 FINDINGS: Small bilateral pleural effusions with bilateral mid to lower lung field hazy densities compatible with atelectasis versus infiltrate. There is no pneumothorax. There is stable cardiomegaly with mild vascular congestion. The bones are osteopenic. No acute fracture. IMPRESSION: Cardiomegaly with mild congestive changes. Small bilateral pleural effusions with bilateral mid to lower lung field atelectasis versus infiltrate. Overall slight increase in the size of the pleural effusion and bibasilar hazy densities compared to the prior radiograph. Follow-up recommended. Electronically Signed   By: Elgie CollardArash  Radparvar M.D.   On: 05/14/2016 07:04   Dg Chest Port 1 View  Result Date: 05/12/2016 CLINICAL DATA:  Acute respiratory failure EXAM: PORTABLE CHEST 1 VIEW COMPARISON:  05/10/2016 FINDINGS: There is cardiomegaly. Mild vascular congestion and bilateral perihilar and lower lobe opacities, likely mild edema. Small bilateral effusions. IMPRESSION: Cardiomegaly. Suspect mild/early edema/ CHF. Small bilateral effusions. Electronically Signed   By: Charlett NoseKevin  Dover M.D.   On: 05/12/2016 15:04    ASSESSMENT / PLAN: Acute respiratory failure secondary to pulmonary edema and AECOPD with hypoventilation.  ABG 12/16 am: 7.24/62/80/26.6; consistent with acute hypercapnic respiratory failure. AKI on CKD with worsening renal failure.  New onset atrial fibrillation CKD stage IV  (worsening)  Echo 05/12/16; EF 35% P: Continuous Bipap at night and prn during day.  Amiodarone  Discussed with nephrology, pt is still contemplating dialysis. Will monitor.  Vascular Surgery plans for Permcath Dialysis if patient consents.    Wells Guiles-Deep Vamsi Apfel, M.D. 05/14/2016  Critical Care Attestation.  I have personally obtained a history, examined the patient, evaluated laboratory and imaging results, formulated the assessment and plan and placed orders. The Patient requires high complexity decision making for assessment and support, frequent evaluation and titration of therapies, application of advanced monitoring technologies and extensive interpretation of multiple databases. The patient has critical illness that could lead imminently to failure of 1 or more organ systems and requires the highest level of physician preparedness to intervene.  Critical Care Time devoted to patient care services described in this note is 45 minutes and is exclusive of time spent in procedures.

## 2016-05-14 NOTE — Progress Notes (Signed)
Pt rested well throughout night with the exception of wanting to eat. RN was only able to place pt on the bipap for an hour before she requested to have it taken off. Sats mid 90's on [facility]. Repeat ABG requested and ordered per Dr. Sheryle Hailiamond. Pt requesting a meeting with palliative, MD and all of her children. Daughter Inocencio HomesGayle was notified and Martie LeeSabrina, dayshift RN was notified.

## 2016-05-14 NOTE — Progress Notes (Signed)
Pt arrived to room 215 via bed. Pt noted to be lethargic and unresponsive with shallow breathing upon arrival. Pt appears comfortable, no s/s of distress. Dr. Anne HahnWillis notified of pt's condition. Also contacted pt.'s daughter,Gail with update on status. Will provide comfort measures.

## 2016-05-15 MED ORDER — MORPHINE SULFATE 10 MG/5ML PO SOLN: 5.0000 mg | ORAL | 0 refills | Status: AC | PRN

## 2016-05-15 MED ORDER — LORAZEPAM 2 MG/ML PO CONC: 1.0000 mg | ORAL | 0 refills | Status: AC | PRN

## 2016-05-15 NOTE — Discharge Summary (Signed)
[facility] - [place] at [facility]  Chrystian Smith, 82 y.o., DOB [birth year], MRN [id number]. Admission date: 05/10/2016 Discharge Date  Primary MD Imelda PillowHOLLAND, CHELSA, NP Admitting Physician Adrian SaranSital Mody, MD  Admission Diagnosis  Acute renal failure, unspecified acute renal failure type [facility](HCC) [N17.9]  Discharge Diagnosis   Active Problems:  Acute hypoxic/hypercarbic respiratory failure  Acute systolic and diastolic heart failure  Acute kidney injury (HCC) on chronic kidney disease stage IV  Palliative care encounter Goals of care, counseling/discussion Anxiety state Atrial fibrillation with rapid ventricular rate Acute left closed old carotid on fracture        Hospital Course  Lacey DanceRebecca Smith  is a 82 y.o. female with a known history of CKD stage 4Was sent from her nephrologist due to acute kidney injury and follow-up for her elbow fracture. Patient was seen on December 10 after mechanical fall and suffered a left elbow fracture. She was advised to follow-up with orthopedic surgery. On the day of admission she contacted her nephrologist who recommended to come to the ER for further evaluation. In the emergency room her creatinine is 3.3 which has increased from the last creatinine we have from September 2016 which is 2.3. She was admitted to the hospital for further evaluation and therapy. Patient was noted to have progressive renal failure and she became oliguric. Patient also started developing acute respiratory failure requiring BiPAP therapy. She was treated in the ICU. Patient was not making any urine. Further discussions were held with the family and the patient. And it was decided to make her comfort care. Patient was transferred to the floor with comfort care. Patient has a bed at hospice facility today.       Please note patient's prognosis is very poor and she has life expectancy of less than 2 weeks.        Consults  pulmonary/intensive  care  Significant Tests:  See full reports for all details    Dg Chest 1 View  Result Date: 05/14/2016 CLINICAL DATA:  82 year old female with shortness of breath. EXAM: CHEST 1 VIEW COMPARISON:  Chest radiograph dated 05/12/2016 FINDINGS: Small bilateral pleural effusions with bilateral mid to lower lung field hazy densities compatible with atelectasis versus infiltrate. There is no pneumothorax. There is stable cardiomegaly with mild vascular congestion. The bones are osteopenic. No acute fracture. IMPRESSION: Cardiomegaly with mild congestive changes. Small bilateral pleural effusions with bilateral mid to lower lung field atelectasis versus infiltrate. Overall slight increase in the size of the pleural effusion and bibasilar hazy densities compared to the prior radiograph. Follow-up recommended. Electronically Signed   By: Elgie CollardArash  Radparvar M.D.   On: 05/14/2016 07:04   Dg Chest 2 View  Result Date: 05/10/2016 CLINICAL DATA:  Shortness of breath today EXAM: CHEST  2 VIEW COMPARISON:  01/08/2016 FINDINGS: There are small bilateral pleural effusions. There is mild atelectasis at the left lung base. No focal consolidation. Moderate cardiomegaly. No pneumothorax. Patchy peripheral opacities on the right could reflect pulmonary infiltrates. IMPRESSION: 1. Small bilateral pleural effusion. Patchy peripheral right lung opacities, possibly related to infiltrates 2. Moderate cardiomegaly without overt failure Electronically Signed   By: Jasmine PangKim  Fujinaga M.D.   On: 05/10/2016 14:25   Dg Elbow Complete Left (3+view)  Result Date: 05/08/2016 CLINICAL DATA:  Fall.  Left elbow pain.  Initial encounter. EXAM: LEFT ELBOW - COMPLETE 3+ VIEW COMPARISON:  None. FINDINGS: There is a comminuted intra-articular fracture of the olecranon with proximal distraction of the main proximal fragment. There is  a large elbow joint effusion. There is no dislocation. No definite fracture is identified of the distal humerus or  proximal radius. Soft tissue swelling is noted posteriorly at the elbow. IMPRESSION: Comminuted olecranon fracture. Electronically Signed   By: Sebastian AcheAllen  Grady M.D.   On: 05/08/2016 21:25   Dg Chest Port 1 View  Result Date: 05/12/2016 CLINICAL DATA:  Acute respiratory failure EXAM: PORTABLE CHEST 1 VIEW COMPARISON:  05/10/2016 FINDINGS: There is cardiomegaly. Mild vascular congestion and bilateral perihilar and lower lobe opacities, likely mild edema. Small bilateral effusions. IMPRESSION: Cardiomegaly. Suspect mild/early edema/ CHF. Small bilateral effusions. Electronically Signed   By: Charlett NoseKevin  Dover M.D.   On: 05/12/2016 15:04       Today   Subjective:   Lacey Smith  Pt comfortable resting daughter at bed side  Objective:   Blood pressure (!) 102/53, pulse 65, temperature 99 F (37.2 C), temperature source Axillary, resp. rate 20, height 5\' 4"  (1.626 m), weight 138 lb (62.6 kg), SpO2 (!) 88 %.  .  Intake/Output Summary (Last 24 hours) at  1004 Last data filed at  0748  Gross per 24 hour  Intake                0 ml  Output              300 ml  Net             -300 ml    Exam VITAL SIGNS: Blood pressure (!) 102/53, pulse 65, temperature 99 F (37.2 C), temperature source Axillary, resp. rate 20, height 5\' 4"  (1.626 m), weight 138 lb (62.6 kg), SpO2 (!) 88 %.  GENERAL:  82 y.o.-year-old patient lying in the bed comfortable EYES: Pupils equal, round, reactive to light and accommodation. No scleral icterus. Extraocular muscles intact.  HEENT: Head atraumatic, normocephalic. Oropharynx and nasopharynx clear.  NECK:  Supple, no jugular venous distention. No thyroid enlargement, no tenderness.  LUNGS: Normal breath sounds bilaterally, no wheezing, rales,rhonchi or crepitation. No use of accessory muscles of respiration.  CARDIOVASCULAR: S1, S2 normal. No murmurs, rubs, or gallops.  ABDOMEN: Soft, nontender, nondistended. Bowel sounds present. No organomegaly or mass.   EXTREMITIES: No pedal edema, cyanosis, or clubbing.  NEUROLOGIC: Cranial nerves II through XII are intact. Muscle strength 5/5 in all extremities. Sensation intact. Gait not checked.  PSYCHIATRIC: not responsive SKIN: No obvious rash, lesion, or ulcer.   Data Review     CBC w Diff: Lab Results  Component Value Date   WBC 11.0 05/14/2016   HGB 8.5 (L) 05/14/2016   HCT 26.6 (L) 05/14/2016   PLT 309 05/14/2016   LYMPHOPCT 9 05/10/2016   MONOPCT 8 05/10/2016   EOSPCT 2 05/10/2016   BASOPCT 1 05/10/2016   CMP: Lab Results  Component Value Date   NA 143 05/14/2016   K 3.9 05/14/2016   CL 106 05/14/2016   CO2 24 05/14/2016   BUN 108 (H) 05/14/2016   CREATININE 4.97 (H) 05/14/2016   PROT 6.2 (L) 05/10/2016   ALBUMIN 2.5 (L) 05/10/2016   BILITOT 0.7 05/10/2016   ALKPHOS 55 05/10/2016   AST 60 (H) 05/10/2016   ALT 27 05/10/2016  .  Micro Results Recent Results (from the past 240 hour(s))  MRSA PCR Screening     Status: None   Collection Time: 05/12/16  6:50 PM  Result Value Ref Range Status   MRSA by PCR NEGATIVE NEGATIVE Final    Comment:  The GeneXpert MRSA Assay (FDA approved for NASAL specimens only), is one component of a comprehensive MRSA colonization surveillance program. It is not intended to diagnose MRSA infection nor to guide or monitor treatment for MRSA infections.         Code Status Orders        Start     Ordered   05/14/16 1751  Do not attempt resuscitation (DNR)  Continuous    Question Answer Comment  In the event of cardiac or respiratory ARREST Do not call a "code blue"   In the event of cardiac or respiratory ARREST Do not perform Intubation, CPR, defibrillation or ACLS   In the event of cardiac or respiratory ARREST Use medication by any route, position, wound care, and other measures to relive pain and suffering. May use oxygen, suction and manual treatment of airway obstruction as needed for comfort.      05/14/16 1752     Code Status History    Date Active Date Inactive Code Status Order ID Comments User Context   05/10/2016  5:50 PM 05/14/2016  5:52 PM DNR [id number]  Adrian Saran, MD Inpatient   02/19/2015  7:05 PM 02/24/2015  4:59 PM DNR [id number]  Alford Highland, MD ED   02/19/2015  6:31 PM 02/19/2015  7:05 PM Full Code [id number]  Alford Highland, MD ED            Discharge Medications   Allergies as of       Reactions   Sulfa Antibiotics       Medication List    STOP taking these medications   acetaminophen 325 MG tablet Commonly known as:  TYLENOL   ALPRAZolam 0.25 MG tablet Commonly known as:  XANAX   alum & mag hydroxide-simeth 200-200-20 MG/5ML suspension Commonly known as:  MAALOX/MYLANTA   amLODipine 10 MG tablet Commonly known as:  NORVASC   aspirin 81 MG EC tablet   cloNIDine 0.2 MG tablet Commonly known as:  CATAPRES   docusate sodium 100 MG capsule Commonly known as:  COLACE   furosemide 20 MG tablet Commonly known as:  LASIX   lisinopril 40 MG tablet Commonly known as:  PRINIVIL,ZESTRIL   metoprolol succinate 100 MG 24 hr tablet Commonly known as:  TOPROL-XL   ondansetron 4 MG tablet Commonly known as:  ZOFRAN   oxyCODONE 5 MG immediate release tablet Commonly known as:  Oxy IR/ROXICODONE   senna 8.6 MG Tabs tablet Commonly known as:  SENOKOT   traMADol 50 MG tablet Commonly known as:  ULTRAM     TAKE these medications   fenofibrate 145 MG tablet Commonly known as:  TRICOR Take 145 mg by mouth daily.   LORazepam 2 MG/ML concentrated solution Commonly known as:  ATIVAN Place 0.5 mLs (1 mg total) under the tongue every 4 (four) hours as needed for anxiety.   morphine 10 MG/5ML solution Take 2.5 mLs (5 mg total) by mouth every 2 (two) hours as needed for moderate pain (or dyspnea).          Total Time in preparing paper work, data evaluation and todays exam - 35 minutes  Auburn Bilberry M.D on  at 10:04 AM  [facility]   Office  [phone]

## 2016-05-15 NOTE — Care Management Note (Signed)
Case Management Note  Patient Details  Name: Berdine DanceRebecca Coddington MRN: [id number] Date of Birth: Apr 27, 1929  Subjective/Objective:    Discussed discharge to Hospice facility again this morning with [facility]. Bed waiting, patient is accepted. [facility] requested that EMS transport not be called until 12noon because they just received another admission. Nurse to nurse report will be called to Debbie at [phone]. Faily is in waiting room and are aware of this admission to Hospice today.                 Action/Plan:   Expected Discharge Date:                  Expected Discharge Plan:     In-House Referral:     Discharge planning Services     Post Acute Care Choice:    Choice offered to:     DME Arranged:    DME Agency:     HH Arranged:    HH Agency:     Status of Service:     If discussed at [employer] Length of Stay Meetings, dates discussed:    Additional Comments:  Myking Sar A, RN , 10:54 AM

## 2016-05-15 NOTE — Progress Notes (Signed)
Patient transferred to hospital via stretcher by EMS; no distress observed. Notified Hospice of patient arrival

## 2016-05-15 NOTE — Discharge Instructions (Signed)
[facility] -  at [facility]  DIET:  Regular diet  DISCHARGE CONDITION:  Critical  ACTIVITY:  Activity as tolerated  OXYGEN:  Home Oxygen: No.   Oxygen Delivery: 2 liters/min via Patient connected to nasal cannula oxygen  DISCHARGE LOCATION:  Hospices home    ADDITIONAL DISCHARGE INSTRUCTION:   If you experience worsening of your admission symptoms, develop shortness of breath, life threatening emergency, suicidal or homicidal thoughts you must seek medical attention immediately by calling 911 or calling your MD immediately  if symptoms less severe.  You Must read complete instructions/literature along with all the possible adverse reactions/side effects for all the Medicines you take and that have been prescribed to you. Take any new Medicines after you have completely understood and accpet all the possible adverse reactions/side effects.   Please note  You were cared for by a hospitalist during your hospital stay. If you have any questions about your discharge medications or the care you received while you were in the hospital after you are discharged, you can call the unit and asked to speak with the hospitalist on call if the hospitalist that took care of you is not available. Once you are discharged, your primary care physician will handle any further medical issues. Please note that NO REFILLS for any discharge medications will be authorized once you are discharged, as it is imperative that you return to your primary care physician (or establish a relationship with a primary care physician if you do not have one) for your aftercare needs so that they can reassess your need for medications and monitor your lab values.

## 2016-05-15 NOTE — Progress Notes (Signed)
[facility]; Comfort care provided and family at bedside; Will continue to monitor.

## 2016-05-30 DEATH — deceased

## 2018-09-21 IMAGING — CR DG CHEST 2V
2 series · 2 of 2 positions shown · non-contrast
Comparison: 01/08/2016

CLINICAL DATA: Shortness of breath today

EXAM:
CHEST  2 VIEW

[chest lat]
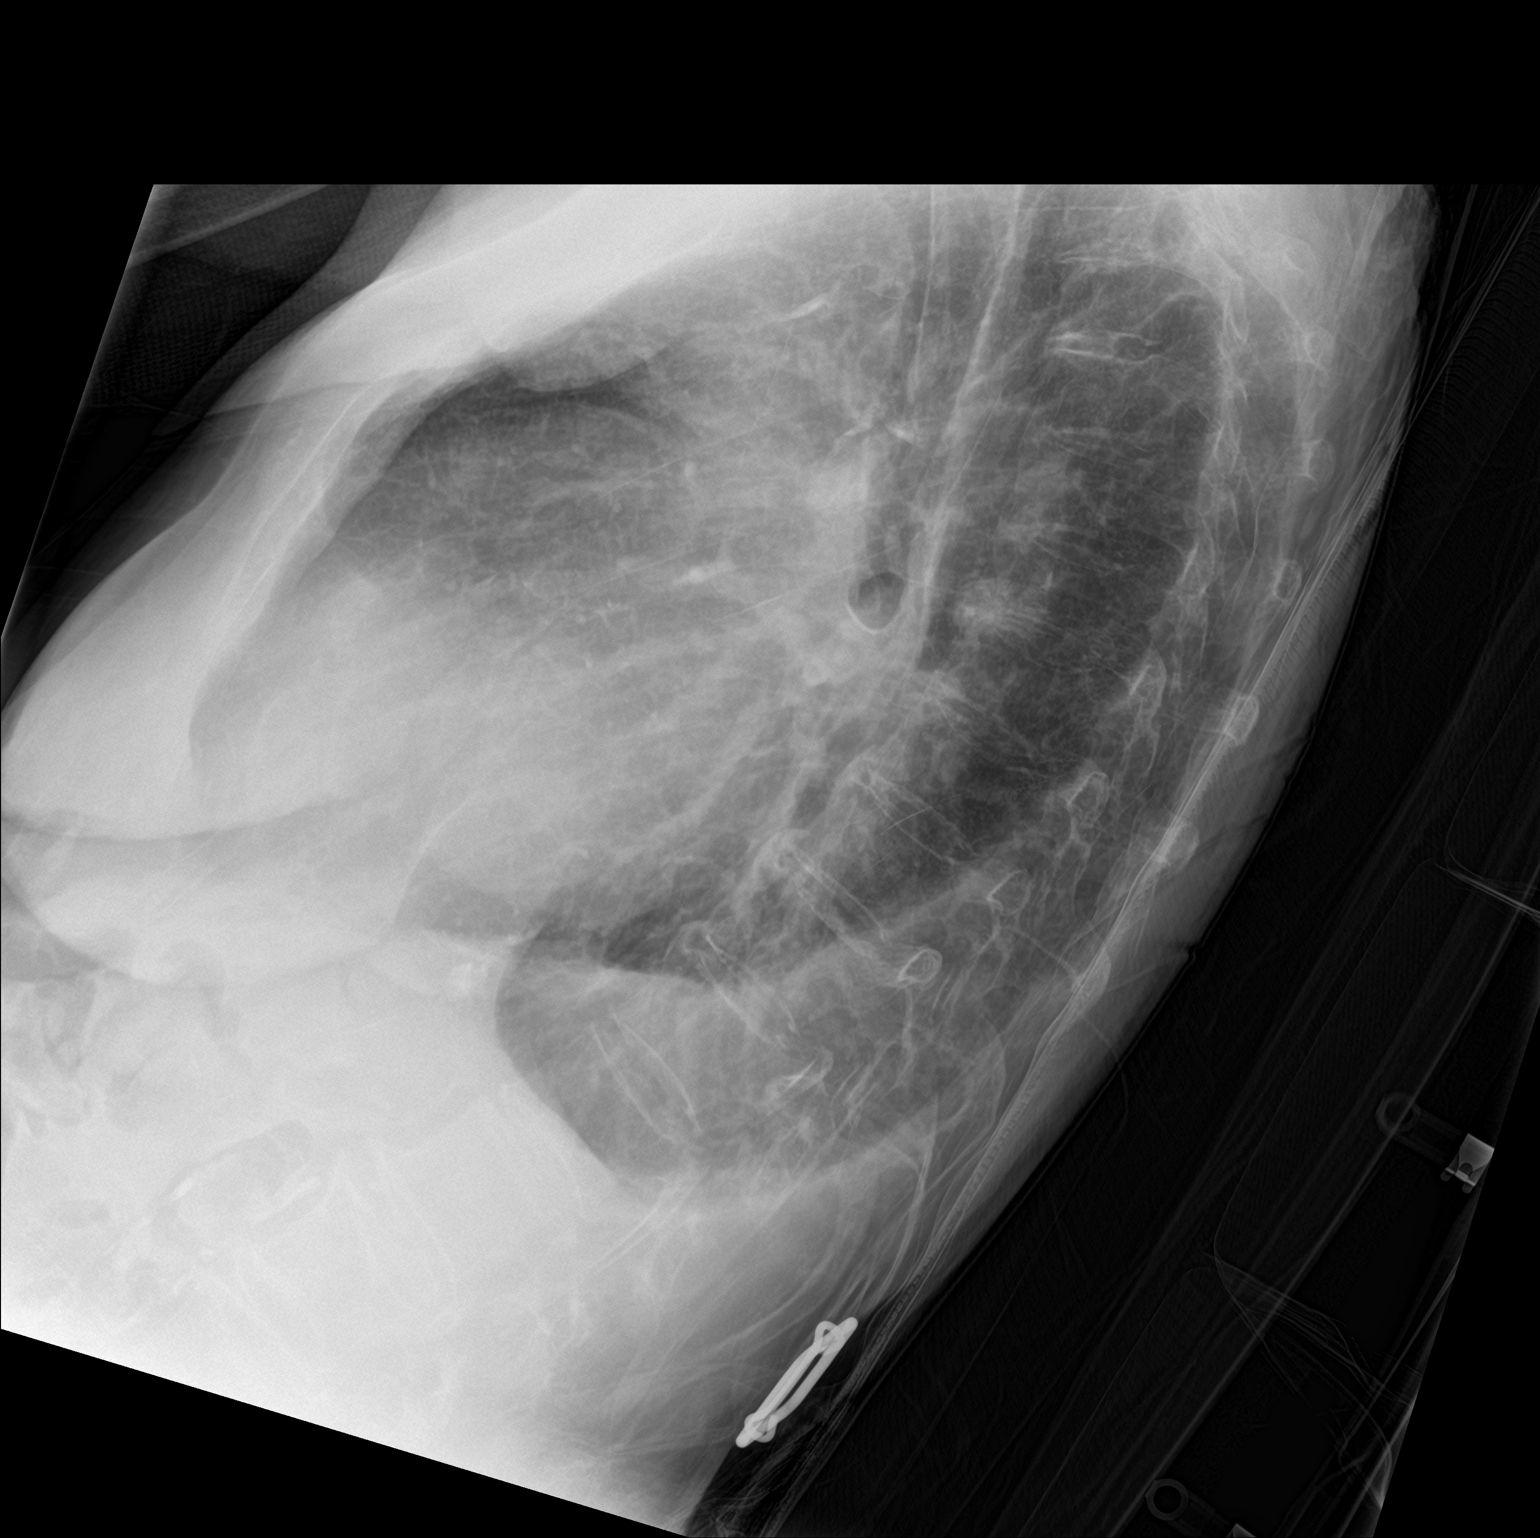

[chest ap]
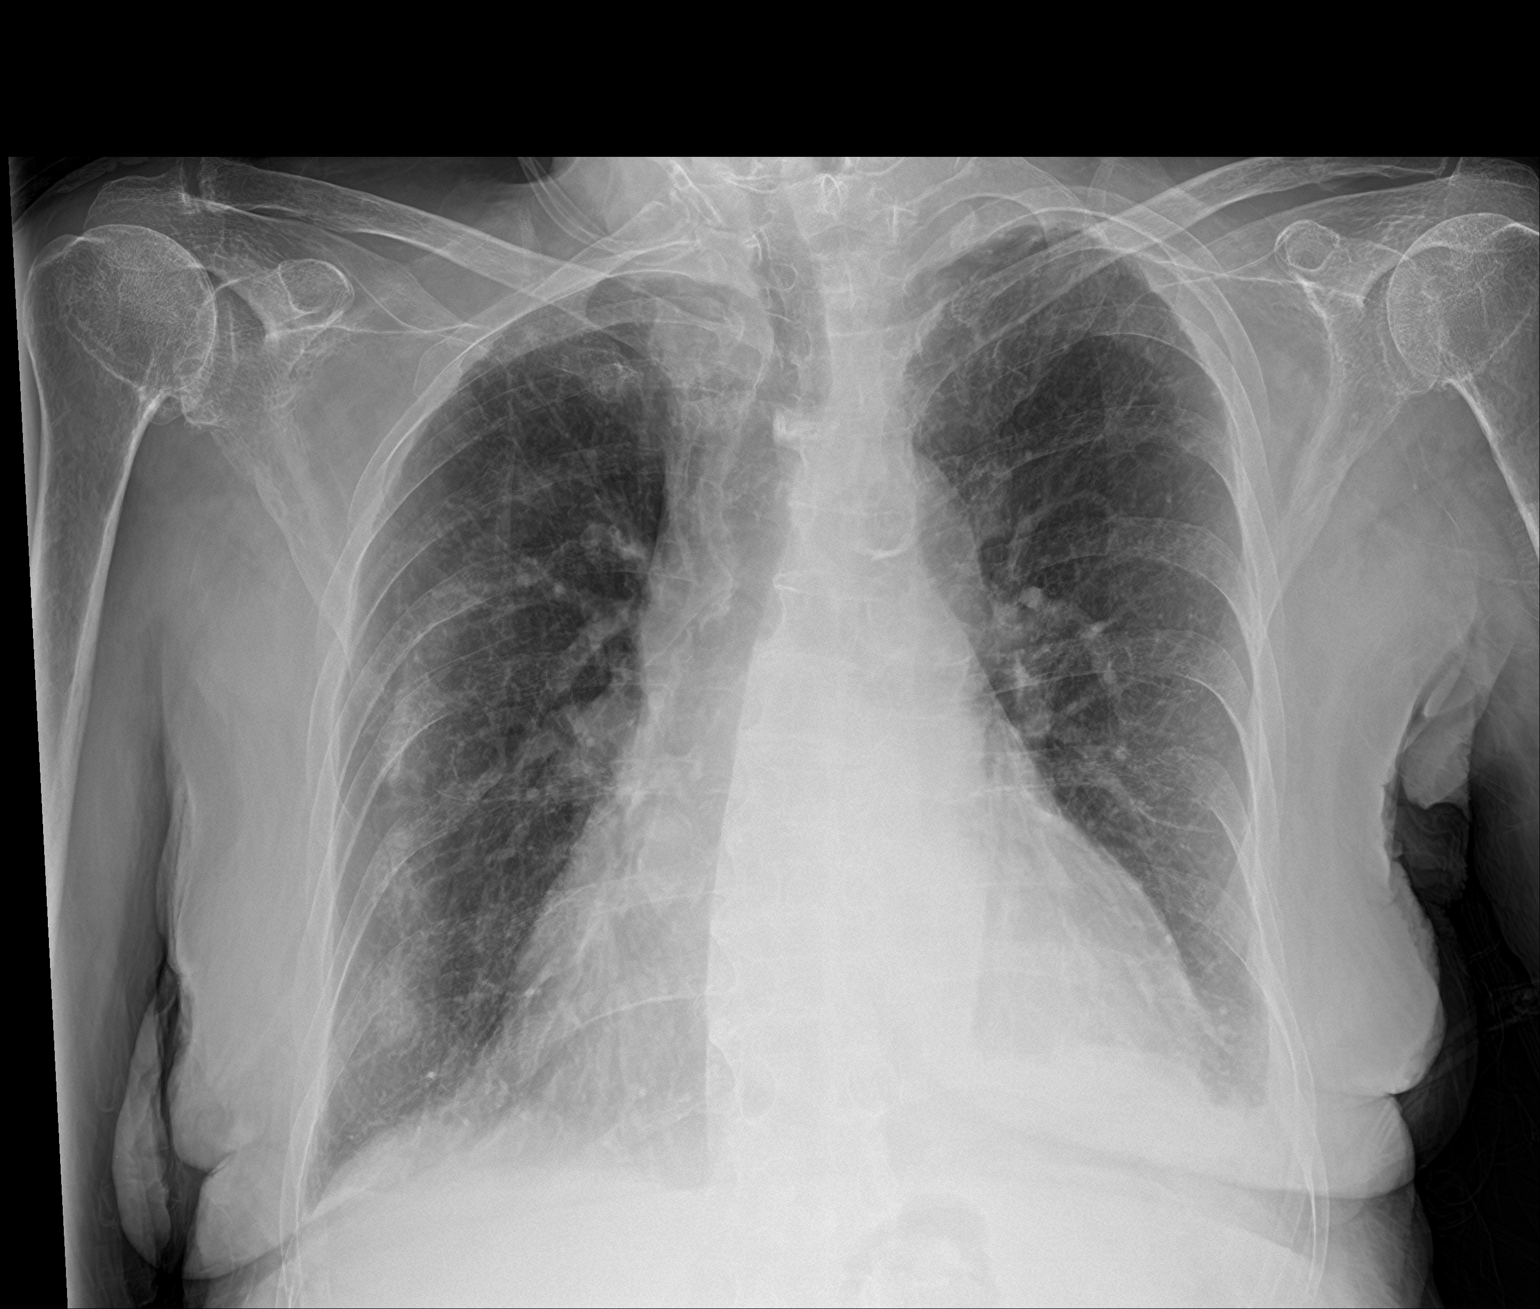

[2 of 2 positions shown; findings below may reference images not displayed]

FINDINGS: There are small bilateral pleural effusions. There is mild
atelectasis at the left lung base. No focal consolidation. Moderate
cardiomegaly. No pneumothorax. Patchy peripheral opacities on the
right could reflect pulmonary infiltrates.
IMPRESSION: 1. Small bilateral pleural effusion. Patchy peripheral right lung
opacities, possibly related to infiltrates
2. Moderate cardiomegaly without overt failure

## 2018-09-25 IMAGING — DX DG CHEST 1V
2 series · 2 of 2 positions shown · non-contrast
Comparison: Chest radiograph dated 05/12/2016

CLINICAL DATA: 87-year-old female with shortness of breath.

EXAM:
CHEST 1 VIEW

[chest ap]
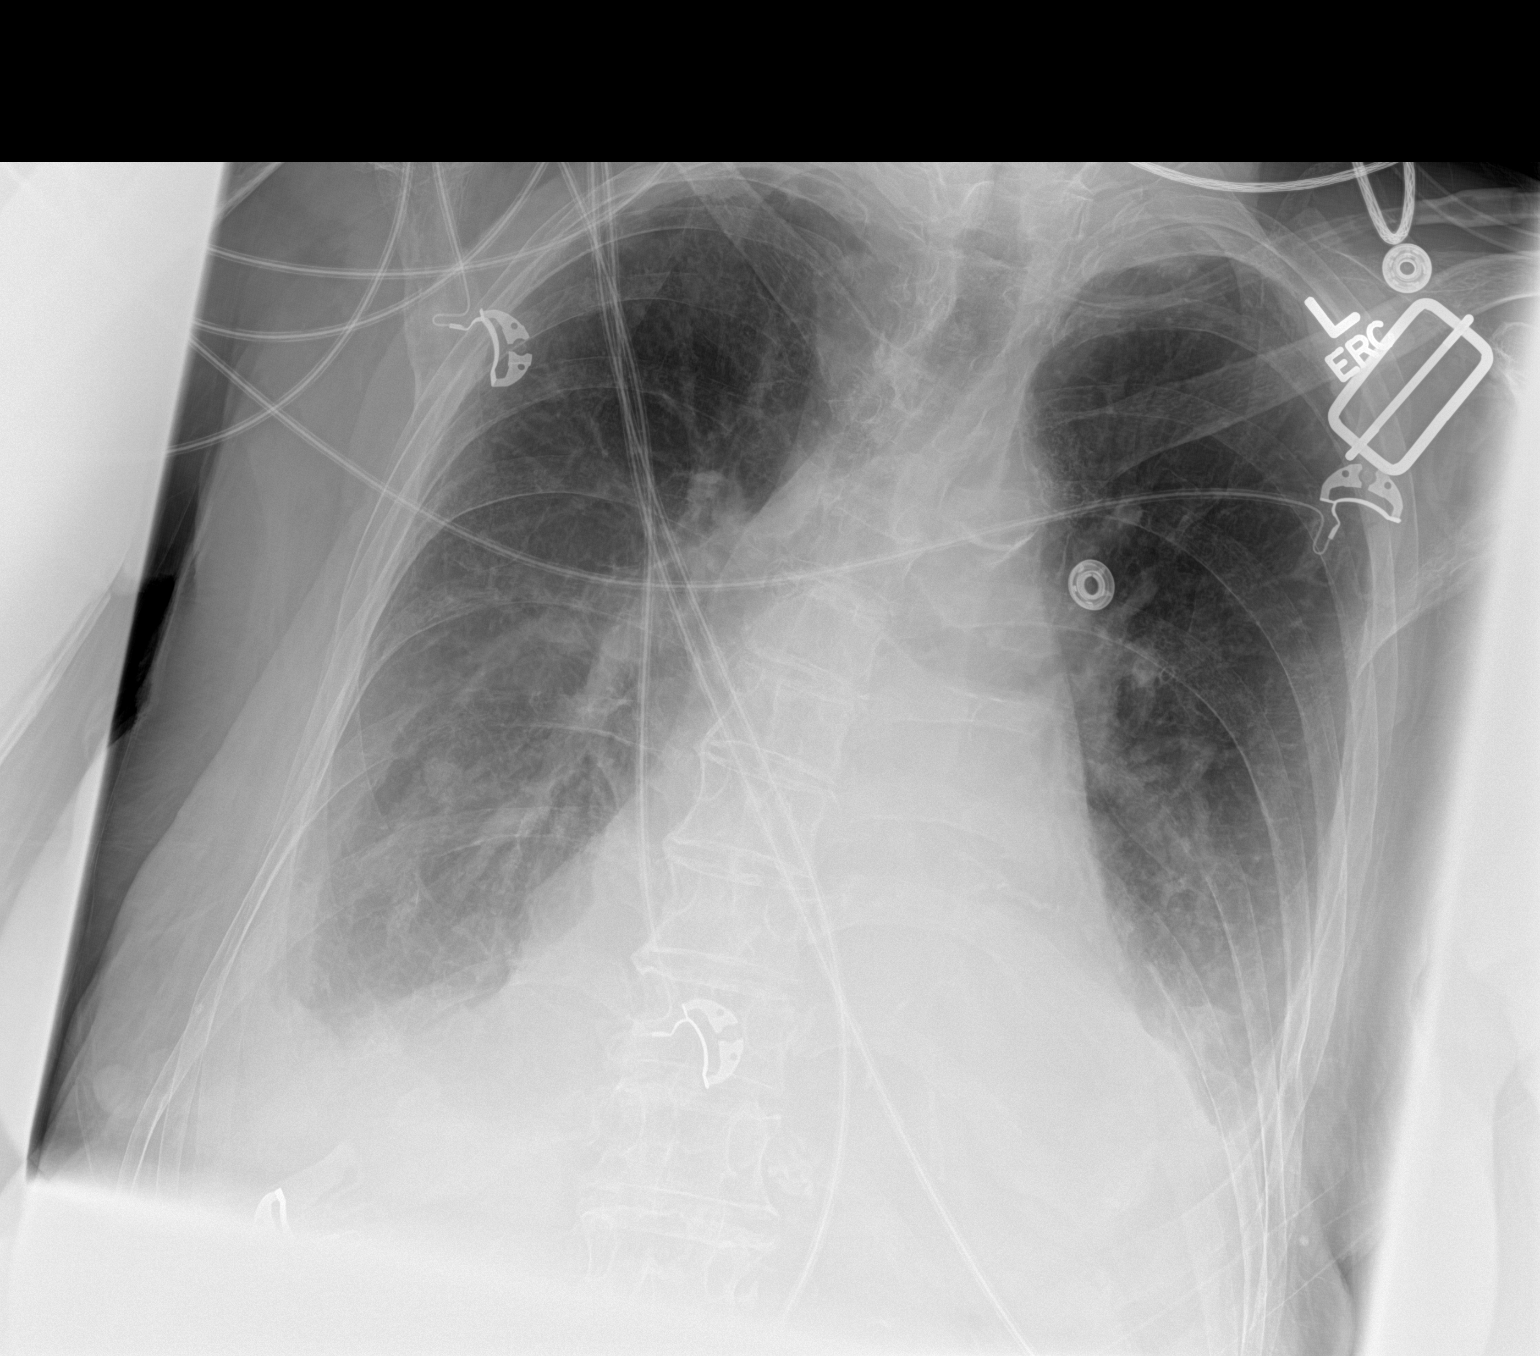

[chest pa]
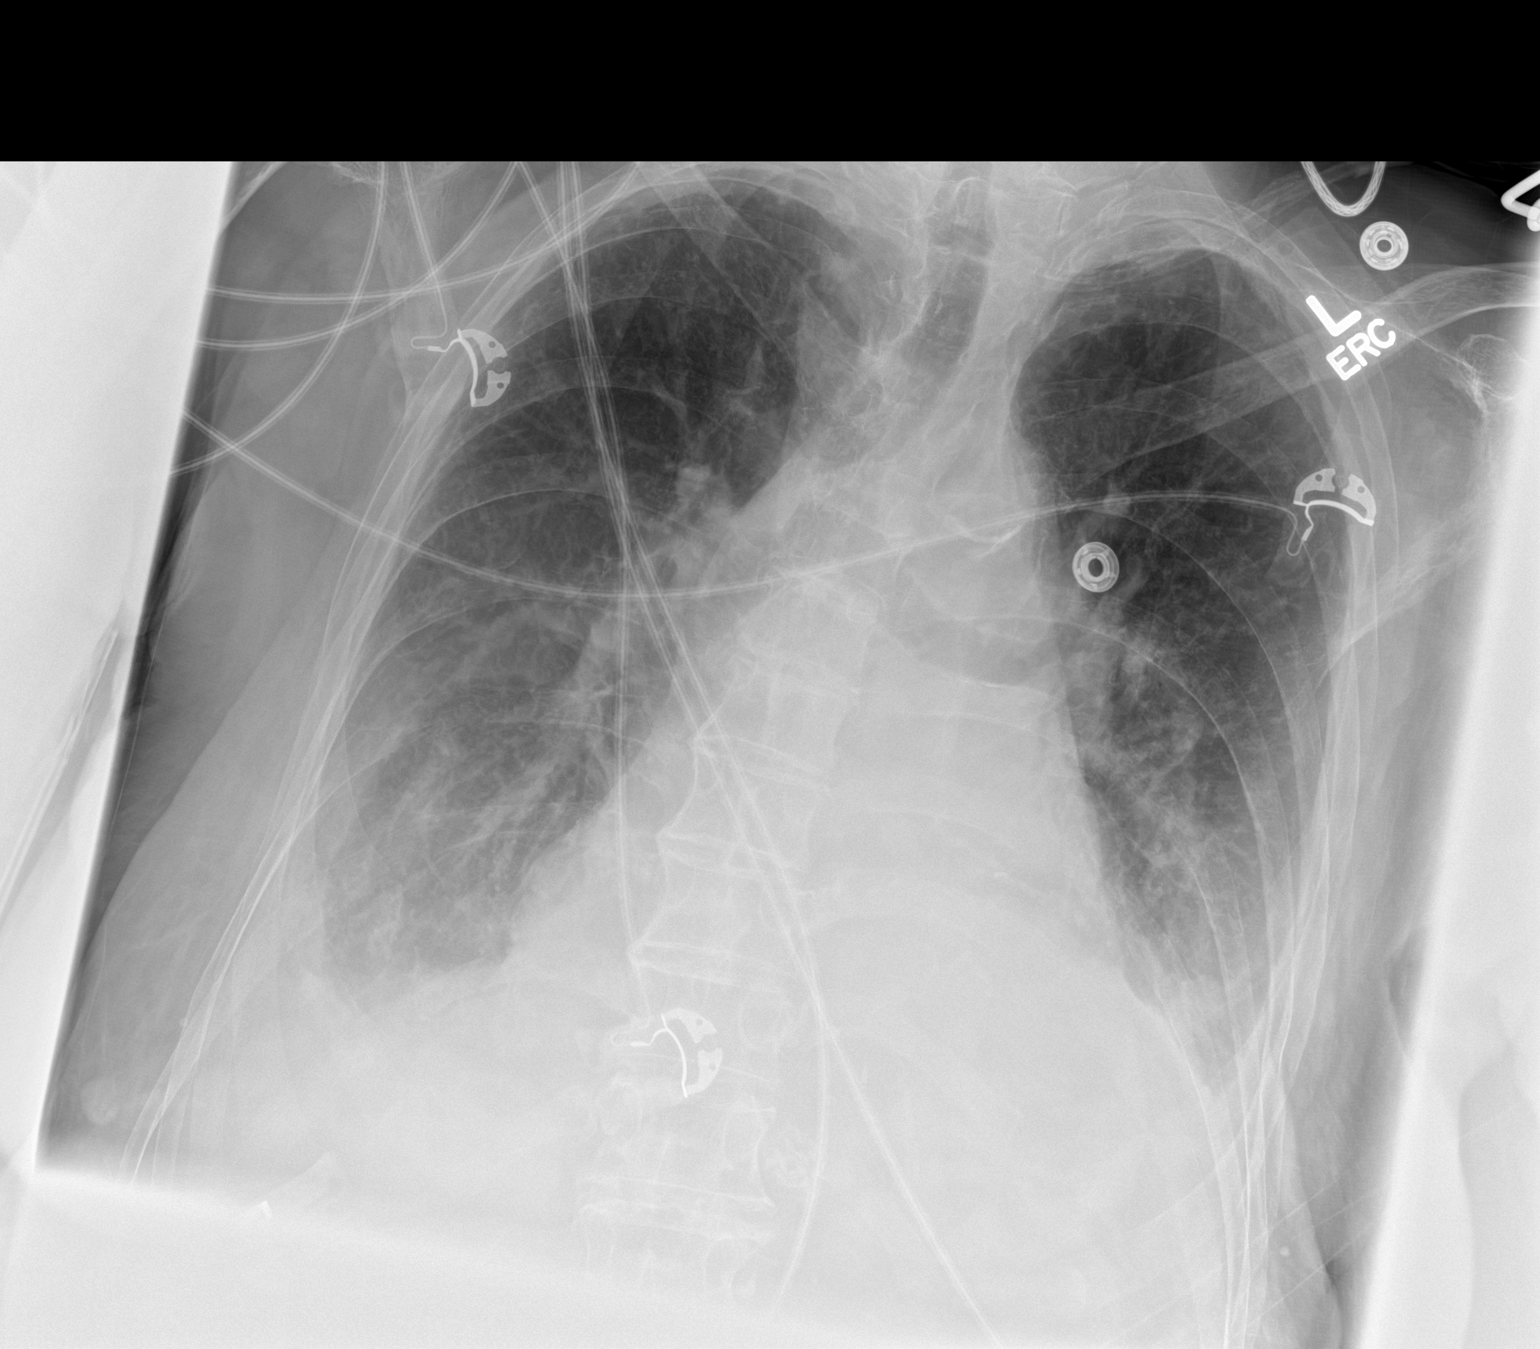

[2 of 2 positions shown; findings below may reference images not displayed]

FINDINGS: Small bilateral pleural effusions with bilateral mid to lower lung
field hazy densities compatible with atelectasis versus infiltrate.
There is no pneumothorax. There is stable cardiomegaly with mild
vascular congestion. The bones are osteopenic. No acute fracture.
IMPRESSION: Cardiomegaly with mild congestive changes.

Small bilateral pleural effusions with bilateral mid to lower lung
field atelectasis versus infiltrate. Overall slight increase in the
size of the pleural effusion and bibasilar hazy densities compared
to the prior radiograph. Follow-up recommended.
